# Patient Record
Sex: Female | Born: 1970 | Race: Black or African American | Hispanic: No | Marital: Married | Smoking: Never smoker
Health system: Southern US, Community
[De-identification: ages and names within clinical notes are randomized; demographics above are authoritative.]

## PROBLEM LIST (undated history)

## (undated) DIAGNOSIS — H9190 Unspecified hearing loss, unspecified ear: Secondary | ICD-10-CM

## (undated) DIAGNOSIS — I10 Essential (primary) hypertension: Secondary | ICD-10-CM

## (undated) DIAGNOSIS — O1490 Unspecified pre-eclampsia, unspecified trimester: Secondary | ICD-10-CM

## (undated) DIAGNOSIS — E559 Vitamin D deficiency, unspecified: Secondary | ICD-10-CM

## (undated) HISTORY — DX: Unspecified pre-eclampsia, unspecified trimester: O14.90

## (undated) HISTORY — DX: Vitamin D deficiency, unspecified: E55.9

## (undated) HISTORY — PX: OTHER SURGICAL HISTORY: SHX169

## (undated) HISTORY — DX: Unspecified hearing loss, unspecified ear: H91.90

## (undated) HISTORY — DX: Essential (primary) hypertension: I10

---

## 1999-04-27 ENCOUNTER — Other Ambulatory Visit: Admission: RE | Admit: 1999-04-27 | Discharge: 1999-04-27 | Payer: Self-pay | Admitting: Family Medicine

## 2002-04-15 ENCOUNTER — Other Ambulatory Visit: Admission: RE | Admit: 2002-04-15 | Discharge: 2002-04-15 | Payer: Self-pay | Admitting: *Deleted

## 2002-05-08 ENCOUNTER — Encounter: Payer: Self-pay | Admitting: *Deleted

## 2002-05-08 ENCOUNTER — Ambulatory Visit (HOSPITAL_COMMUNITY): Admission: RE | Admit: 2002-05-08 | Discharge: 2002-05-08 | Payer: Self-pay | Admitting: *Deleted

## 2003-03-31 ENCOUNTER — Other Ambulatory Visit: Admission: RE | Admit: 2003-03-31 | Discharge: 2003-03-31 | Payer: Self-pay | Admitting: Gynecology

## 2003-08-12 ENCOUNTER — Inpatient Hospital Stay (HOSPITAL_COMMUNITY): Admission: AD | Admit: 2003-08-12 | Discharge: 2003-08-12 | Payer: Self-pay | Admitting: Gynecology

## 2003-08-22 ENCOUNTER — Inpatient Hospital Stay (HOSPITAL_COMMUNITY): Admission: AD | Admit: 2003-08-22 | Discharge: 2003-08-23 | Payer: Self-pay | Admitting: Gynecology

## 2003-09-02 ENCOUNTER — Inpatient Hospital Stay (HOSPITAL_COMMUNITY): Admission: AD | Admit: 2003-09-02 | Discharge: 2003-09-07 | Payer: Self-pay | Admitting: Gynecology

## 2003-09-04 ENCOUNTER — Encounter (INDEPENDENT_AMBULATORY_CARE_PROVIDER_SITE_OTHER): Payer: Self-pay | Admitting: Specialist

## 2003-09-07 ENCOUNTER — Inpatient Hospital Stay (HOSPITAL_COMMUNITY): Admission: AD | Admit: 2003-09-07 | Discharge: 2003-09-09 | Payer: Self-pay | Admitting: Gynecology

## 2003-09-10 ENCOUNTER — Ambulatory Visit: Admission: RE | Admit: 2003-09-10 | Discharge: 2003-09-10 | Payer: Self-pay | Admitting: Internal Medicine

## 2003-10-16 ENCOUNTER — Other Ambulatory Visit: Admission: RE | Admit: 2003-10-16 | Discharge: 2003-10-16 | Payer: Self-pay | Admitting: Gynecology

## 2004-07-12 ENCOUNTER — Ambulatory Visit: Payer: Self-pay | Admitting: Internal Medicine

## 2004-10-17 ENCOUNTER — Other Ambulatory Visit: Admission: RE | Admit: 2004-10-17 | Discharge: 2004-10-17 | Payer: Self-pay | Admitting: Gynecology

## 2005-01-10 ENCOUNTER — Ambulatory Visit: Payer: Self-pay | Admitting: Internal Medicine

## 2005-03-07 ENCOUNTER — Other Ambulatory Visit: Admission: RE | Admit: 2005-03-07 | Discharge: 2005-03-07 | Payer: Self-pay | Admitting: Gynecology

## 2005-07-11 ENCOUNTER — Ambulatory Visit: Payer: Self-pay | Admitting: Internal Medicine

## 2005-07-31 ENCOUNTER — Ambulatory Visit: Payer: Self-pay | Admitting: Endocrinology

## 2005-08-03 ENCOUNTER — Ambulatory Visit: Payer: Self-pay | Admitting: Internal Medicine

## 2005-08-11 ENCOUNTER — Encounter (INDEPENDENT_AMBULATORY_CARE_PROVIDER_SITE_OTHER): Payer: Self-pay | Admitting: Specialist

## 2005-08-11 ENCOUNTER — Inpatient Hospital Stay (HOSPITAL_COMMUNITY): Admission: AD | Admit: 2005-08-11 | Discharge: 2005-08-15 | Payer: Self-pay | Admitting: Gynecology

## 2005-09-25 ENCOUNTER — Other Ambulatory Visit: Admission: RE | Admit: 2005-09-25 | Discharge: 2005-09-25 | Payer: Self-pay | Admitting: Gynecology

## 2006-11-02 ENCOUNTER — Other Ambulatory Visit: Admission: RE | Admit: 2006-11-02 | Discharge: 2006-11-02 | Payer: Self-pay | Admitting: Gynecology

## 2007-01-08 ENCOUNTER — Encounter: Payer: Self-pay | Admitting: Internal Medicine

## 2007-11-18 ENCOUNTER — Other Ambulatory Visit: Admission: RE | Admit: 2007-11-18 | Discharge: 2007-11-18 | Payer: Self-pay | Admitting: Gynecology

## 2008-02-03 ENCOUNTER — Ambulatory Visit: Payer: Self-pay | Admitting: Gynecology

## 2008-12-08 ENCOUNTER — Ambulatory Visit: Payer: Self-pay | Admitting: Gynecology

## 2008-12-08 ENCOUNTER — Encounter: Payer: Self-pay | Admitting: Gynecology

## 2008-12-08 ENCOUNTER — Other Ambulatory Visit: Admission: RE | Admit: 2008-12-08 | Discharge: 2008-12-08 | Payer: Self-pay | Admitting: Gynecology

## 2010-01-05 ENCOUNTER — Other Ambulatory Visit: Admission: RE | Admit: 2010-01-05 | Discharge: 2010-01-05 | Payer: Self-pay | Admitting: Gynecology

## 2010-01-05 ENCOUNTER — Ambulatory Visit: Payer: Self-pay | Admitting: Gynecology

## 2010-02-28 ENCOUNTER — Ambulatory Visit: Payer: Self-pay | Admitting: Gynecology

## 2010-08-02 ENCOUNTER — Ambulatory Visit (INDEPENDENT_AMBULATORY_CARE_PROVIDER_SITE_OTHER): Payer: BC Managed Care – PPO | Admitting: Gynecology

## 2010-08-02 DIAGNOSIS — B373 Candidiasis of vulva and vagina: Secondary | ICD-10-CM

## 2010-08-02 DIAGNOSIS — L293 Anogenital pruritus, unspecified: Secondary | ICD-10-CM

## 2010-08-02 DIAGNOSIS — N898 Other specified noninflammatory disorders of vagina: Secondary | ICD-10-CM

## 2010-09-09 NOTE — Discharge Summary (Signed)
NAME:  Adriana Brown, Adriana Brown                        ACCOUNT NO.:  [id number]   MEDICAL RECORD NO.:  [id number]                   PATIENT TYPE:  INP   LOCATION:  9319                                 FACILITY:  [facility]   PHYSICIAN:  Juan H. Lily Peer, M.D.             DATE OF BIRTH:  [birth year]   DATE OF ADMISSION:  09/02/2003  DATE OF DISCHARGE:  09/07/2003                                 DISCHARGE SUMMARY   TOTAL DAYS HOSPITALIZED:  Five.   HISTORY:  The patient is a 40 year old gravida 1 para 0, now para 1, who was  admitted at 35 and five-sevenths weeks gestation due to the fact that she  had been complaining of abdominal pain and elevated blood pressures.  She  had been known to have chronic hypertension and had been on Aldomet 500 mg  t.i.d.  At [facility] she was found to have blood pressures that were  153/105, 163/111, 160/112, 163/109.  She was given 20 mg of labetalol IV,  was admitted to antepartum for continued monitoring and plans of starting on  magnesium sulfate tocolysis and subsequently giving her betamethasone 12.5  mg and repeating in 24 hours with plans of early delivery.  Her labs in the  emergency room had demonstrated hemoglobin and hematocrit of 12.5 and 37.1  respectively with platelet count of 174,00.  Comprehensive metabolic panel:  SGOT at 64, SGPT at 68, LDH was slightly elevated at 263.  Her urine had  evidence of proteinuria at 100 mg/dl, and trace ketonuria.  An ultrasound  was obtained which demonstrated the fetus to be in the vertex presentation,  AFI of 12.6, estimated fetal weight was between the 10th to 25th percentile  with an abdominal circumference slightly lagging measuring 31 weeks as well  as the BPP and femur length.  On the following day the [facility] panel was  repeated, platelet count was 166, LDH 249, uric acid 6.5, SGOT and SGPT 80  and 86 respectively, and urine output was averaging about 30-50 mL/hour.  She was started on Cervidil and 24 hours  later was started on Pitocin.  Since she was early into her pregnancy group B strep prophylaxis initiated  with 5,000,000 units of penicillin G IV followed by 2,500,000 units q.4h.  She underwent artificial rupture of membranes with clear fluid.  High-dose  Pitocin was started and she was on magnesium sulfate at 2 g/hour after a 4 g  bolus had been given on admission.  She had 2+ pitting edema and two-beat  clonus.  Her systolics were in the 130s and diastolics were in the 80-90  range.  She eventually was taken for a primary cesarean section secondary to  arrest of first stage of labor and nonreassuring fetal heart rate tracing.  The primary lower uterine segment transverse cesarean section resulted in a  viable female infant, Apgars of 7 and 8, with a weight of 3 pounds 11  ounces, arterial cord pH 7.24, and she had 600 mL blood loss.  Postpartum  day #1 her hemoglobin and hematocrit were 9.4 and 28.6; platelets 145,000.  Blood pressure systolics in the 130-140 range, the diastolic 70-80s.  Urine  output averaged 200 mL/hour.  O2 saturations were 92-97%.  Lungs had been  clear.  She still had 1+ pitting edema.  Her magnesium sulfate was  discontinued after 24 hours.  Postpartum day #2 her Tmax was 100, systolics  140-160 mmHg range, diastolics 70-90 mmHg range.  O2 saturations on room air  were ranging between 91% and 93%.  Respiratory rate had been 18-20 with  excellent urine output and the lungs were clear.  She was sent for a chest x-  ray which was reported to be normal with a small amount of pleural effusion  noted at the base and so she was given 40 of Lasix p.o.  She continued to  urinate well and she was transferred to the ward.  On postoperative day #3  her blood pressures were 153/91, 150/95, 154/93, 170/98, 167/65.  She had  excellent urine output.  O2 saturations on room air were 97% and her pitting  edema was now down to 1+ with no clonus, and she was ready to be discharged   home.  She was released on Aldomet 500 mg t.i.d.  She was to continue her  iron supplementation and her prenatal vitamins.  Her  staples were removed and incision had Steri-Strips applied.  She was to  maintain blood pressure readings at home three to four times a day and was  to be seen in the office in 48-72 hours for follow-up blood pressure  measurements.  Signs and symptoms of toxemia were discussed with her and  blood type was O positive, rubella immune.                                               Juan H. Lily Peer, M.D.    JHF/MEDQ  D:  09/15/2003  T:  09/15/2003  Job:  [id number]

## 2010-09-09 NOTE — Discharge Summary (Signed)
Adriana, Brown              ACCOUNT NO.:  [id number]   MEDICAL RECORD NO.:  [id number]          PATIENT TYPE:  INP   LOCATION:  9317                          [facility]ACILITY[facility]  [facility]   PHYSICIAN:  Juan H. Lily Peer, M.D.DATE OF BIRTH:  1970-06-25   DATE OF ADMISSION:  08/11/2005  DATE OF DISCHARGE:  08/15/2005                                 DISCHARGE SUMMARY   HISTORY:  Patient is a 40 year old gravida 2, para 1 with previous cesarean  section at [redacted] weeks gestation for HELLP syndrome.  Patient with hypertension  during this pregnancy had been on labetalol had presented to the office at  [redacted] weeks gestation with bright red bleeding per vagina and was found to have  elevated blood pressure and was taken for an emergency cesarean section with  suspicion of an abruptio placenta.  The patient underwent a repeat lower  uterine segment transverse cesarean section by Dr. Douglass Rivers.  The  patient delivered a viable female infant in the vertex presentation with  Apgars of 8 and 9.  There was noted at time of delivery 15% of the placenta  had been abrupted but pathology report confirmed that there was organized  hemorrhage on the maternal surface consistent with an abruptio three-vessel  umbilical cord, no abnormalities noted in the cord and no evidence of  chorioamnionitis.  The patient's PIH labs on admission were normal.  She was  kept in the AICU with magnesium sulfate 4 gram bolus followed by 2 grams/hr  and eventually after 24 hours it was discontinued and she was kept on her  labetalol.  Her first postop day hemoglobin/hematocrit were 10.1 and 29.4,  respectively, with a platelet count 154,000.  Her comprehensive metabolic  panel was normal.  Of note on admission the patient did have Kleihauer-Betke  test done which was negative for fetal cells in the maternal circulation.  After 24 hours the magnesium sulfate had been discontinued and the patient's  Foley catheter was removed after she had  adequate urinary output.  She was  started on clear liquid diet and advanced to regular diet and she was kept  on labetalol at 200 mg b.i.d. and was ready to be discharged home on the  third postoperative day.  She was O positive, rubella immune, her incision  site was intact, staples were removed, the incision was Steri-Stripped and  she was ready for discharge home.   FINAL DIAGNOSES:  1.  Abruptio placenta at [redacted] weeks gestation.  2.  Previous cesarean section.  3.  Chronic hypertension.  4.  Anemia.   PROCEDURE PERFORMED:  Repeat lower uterine segment transverse cesarean  section.   DISPOSITION AND FOLLOWUP:  The patient was discharged home on her fourth  postoperative day, she was up ambulating well, tolerating regular diet well,  her staples were removed, her incision was Steri-Stripped.  She was given a  prescription for iron supplementation to take one p.o. daily along with  prenatal vitamins one p.o. daily, for pain relief Lortab 7.5/500 one p.o.  daily and interchange that with Motrin 800 t.i.d. p.r.n.  She will continue  on labetalol 200 mg b.i.d. as the same she was taking it in the hospital and  to follow up in the office next week for blood pressure check.     Juan H. Lily Peer, M.D.  Electronically Signed    JHF/MEDQ  D:  09/01/2005  T:  09/03/2005  Job:  [id number]

## 2010-09-09 NOTE — H&P (Signed)
NAME:  Adriana Brown, Adriana Brown                        ACCOUNT NO.:  [id number]   MEDICAL RECORD NO.:  [id number]                   PATIENT TYPE:  INP   LOCATION:  9166                                 [facility]   PHYSICIAN:  Juan H. Lily Peer, M.D.             DATE OF BIRTH:  1971/03/02   DATE OF ADMISSION:  09/02/2003  DATE OF DISCHARGE:                                HISTORY & PHYSICAL   CHIEF COMPLAINT:  Abdominal pain, elevated blood pressure.   HISTORY:  The patient is a 40 year old gravida 1 para 0 currently 59 and  five-sevenths weeks gestation who had presented to [facility]  approximately 0400 complaining of abdominal discomfort.  She stated that  this started around 3:30 a.m. and she got up and had her blood pressure  taken and her diastolic blood pressure was 115 mmHg.  She denied any  headaches or any visual disturbances.  She was diagnosed with chronic  hypertension at approximately [redacted] weeks gestation and had been on Aldomet 500  mg t.i.d.  When she presented to [facility] her blood pressures were  as follows:  153/105, 163/111, 160/112, 163/109, and she was given 20 mg of  labetalol IV which began to bring down her blood pressures.  When she was  brought up to the room her blood pressure was now 144/91 and the patient did  not complain of any visual disturbances and did not have any right upper  quadrant tenderness on exam but stated it was kind of a vague discomfort in  her upper abdomen.  Her labs in the emergency room demonstrated hemoglobin  and hematocrit at 12.5 and 37.1 respectively with platelet count of 174,000;  white blood count 18,000.  Comprehensive metabolic panel had an elevated  SGOT at 64, SGPT elevated at 68, and LDH was elevated slightly at 263.  Her  urine had evidence of proteinuria 100 mg/dl and trace ketonuria.  We  proceeded in obtaining an ultrasound in an effort to get fetal Doppler flow  studies.  The fetal Doppler flow studies for [redacted]  weeks gestation was normal.  The fetus is in the vertex presentation with an AFI of 12.6.  The estimated  fetal weight was between the 10th and 25th percentile with abdominal  circumference consistent with 31 weeks as well as the BPD and femur length.  The patient was then given betamethasone 12.5 mg IM which we will repeat in  24 hours and she was started on magnesium sulfate tocolysis consisting of a  4 g bolus of magnesium sulfate followed by 2 g/hour.   PAST MEDICAL HISTORY:  1. 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  2. Had a wisdom tooth removed in 1999.  3. Chronic hypertension established at [redacted] weeks gestation.   MEDICATIONS:  Aldomet - she is up to 500 mg t.i.d.   Maternal serum alpha-fetoprotein had been declined.  The patient did have  parvovirus B19  tested due to concern of exposure she may have had to someone  with fifth disease and was reported to be negative.   REVIEW OF SYSTEMS:  See [place] form.   PHYSICAL EXAMINATION:  VITAL SIGNS:  Current blood pressure was 114/91.  HEENT:  Unremarkable.  NECK:  Supple, trachea midline.  No carotid bruits, no thyromegaly.  LUNGS:  Clear to auscultation without any rhonchi or wheezes.  HEART:  Regular rate and rhythm without murmurs, rubs, or gallops.  BREAST:  Exam not done.  ABDOMEN:  Soft, no tenderness.  PELVIC:  Cervix was 1 cm, 50% effaced, and posterior.  EXTREMITIES:  Reflexes DTR 2+, 1+ to 2+ pitting edema.   ASSESSMENT:  A 40 year old gravida 1 para 0 at 13 and five-sevenths weeks  gestation with chronic hypertension/superimposed preeclampsia awoke this  morning with abdominal discomfort, took her blood pressure at home and found  to be elevated, and was instructed to come to the hospital.  Her blood  pressure was found to be elevated but responded to labetalol and magnesium  sulfate for seizure prophylaxis.  She received 12.5 mg of betamethasone.  A  second dose will be repeated in 24 hours.  We will repeat  her PIH panel in  the next 6-8 hours and continue to monitor closely.  We had discussed that  we hoped to get a second dose of the betamethasone to stimulate fetal lung  maturity in an effort to deliver her if her clinical parameters indicate  that the condition is deteriorating.  Will also have the neonatal team come  and talk to the patient as well and she understands that condition is  fragile and will have to be making adjustments accordingly, but a decision  may need to be made to be delivered prematurely if she does not respond to  bedrest and antihypertensive medication for which we will increase Aldomet  from 500 t.i.d. to 500 q.i.d. and continue to monitor closely.  Her prenatal  labs are in chart and admission labs as dictated above.   PLAN:  Per assessment above.                                               Juan H. Lily Peer, M.D.    JHF/MEDQ  D:  09/02/2003  T:  09/02/2003  Job:  [id number]

## 2010-09-09 NOTE — Consult Note (Signed)
NAME:  Adriana Brown, Adriana Brown                        ACCOUNT NO.:  [id number]   MEDICAL RECORD NO.:  [id number]                   PATIENT TYPE:  INP   LOCATION:  9173                                 [facility]   PHYSICIAN:  Juan H. Lily Peer, M.D.             DATE OF BIRTH:  [birth year]   DATE OF CONSULTATION:  DATE OF DISCHARGE:                                   CONSULTATION   HISTORY:  The patient is a 40 year old gravida 1, para 0, who presented to  [facility] stating that earlier this evening she felt like her heart  was pacing, so she went to the [facility] and had her blood pressure taken and was  told that her blood pressure was slightly elevated but her pulse was normal.  The patient denied any visual disturbances, no headaches, no right upper  quadrant pain, no prior history of hypertension.  So far, this pregnancy has  been uncomplicated.  She is currently 30-1/2 weeks' gestation.  When she  came in, her vital signs were as follows:  Temperature was 98.7, pulse 84,  respirations 20, blood pressure 147/81.  Serial blood pressure readings were  obtained, and they were as follows:  127/86, 133/88, 132/82, 131/88, 133/79.  A PIH panel was done, where the hemoglobin and hematocrit were 11.3 and  33.9, respectively, platelet count 199,000, white blood count was 16.8.  Her  urinalysis essentially unremarkable.  Urine slightly concentrated but  otherwise normal parameters.  Wet prep was normal, and LDH, uric acid, and  the remainder of comprehensive metabolic panel was normal.  She had a  reactive nonstress test.   PHYSICAL EXAMINATION:  GENERAL:  The patient was in no acute distress.  HEENT:  Unremarkable.  NECK:  Supple.  Trachea midline, no carotid bruits, no thyromegaly.  CHEST:  The lungs were clear to auscultation without any rhonchi or wheezes.  CARDIAC:  Regular rate and rhythm, no murmurs or gallops.  BREASTS:  Exam not done.  ABDOMEN:  Soft, nontender, no rebound or  guarding.  PELVIC:  Not done.  EXTREMITIES:  DTR 1+, negative clonus, trace edema.   ASSESSMENT:  A 39 year old gravida 1, para 0, at 30-1/2 weeks' estimated  gestational age, earlier this evening with episode of pacing in her heart.  She was placed on the monitor and found to be noncontracting, normotensive,  borderline blood pressure reading but normal heart rate, and no abnormality  on cardiac auscultation.  The patient has an appointment tomorrow in the  office.  I will see her 30 minutes before scheduled appointment and do  serial blood pressure readings.  Besides the above-mentioned PIH panel  having been done, will also proceed with getting a TSH tonight and will  reassess tomorrow when she has her appointment.  Instruction sheet on signs  and symptoms of preeclampsia were provided, all questions were answered, and  will follow accordingly.  Juan H. Lily Peer, M.D.    JHF/MEDQ  D:  08/12/2003  T:  08/13/2003  Job:  [id number]

## 2010-09-09 NOTE — Op Note (Signed)
Adriana Brown, Adriana Brown              ACCOUNT NO.:  [id number]   MEDICAL RECORD NO.:  [id number]          PATIENT TYPE:  INP   LOCATION:  9317                          [facility]ACILITY:  [facility]   PHYSICIAN:  Ivor Costa. Farrel Gobble, M.D. DATE OF BIRTH:  1970/11/28   DATE OF PROCEDURE:  08/11/2005  DATE OF DISCHARGE:                                 OPERATIVE REPORT   PREOPERATIVE DIAGNOSES:  1.  Abruption at 34 weeks.  2.  Previous cesarean section.  3.  Chronic hypertension.   POSTOPERATIVE DIAGNOSES:  1.  Abruption at 34 weeks.  2.  Previous cesarean section.  3.  Chronic hypertension.   PROCEDURE:  Repeat cesarean section, low flap transverse.   SURGEON:  Ivor Costa. Farrel Gobble, M.D.   ASSISTANT:  Scrub tech.   ANESTHESIA:  Spinal.   ESTIMATED BLOOD LOSS:  600 mL.   IV FLUIDS:  1700 mL of lactated Ringer's.   URINE OUTPUT:  100 mL of clear urine.   FINDINGS:  A viable female in the vertex presentation with Apgars of 8 and 9.  Birth weight is pending.  Roughly 15% placenta abruption was noted at the  time of delivery.  Tubes and ovaries were unremarkable.  The pH was 7.2.   COMPLICATIONS:  None.   PATHOLOGY:  Placenta.   PROCEDURE:  The patient was taken to the operating room.  Spinal anesthesia  was induced after adequate platelet level was appreciated.  Prep and drape  in the usual sterile fashion.  After adequate anesthesia was insured, a  Pfannenstiel skin incision was made with a scalpel going through the  previous C-section scar and carried through the underlying layer of fascia  with the scalpel.  The fascia was nicked and incision was extended laterally  with the Bovie.  The superior aspect of the incision was grasped with  Kochers and underlying rectus muscles were dissected off by blunt and sharp  dissection.  They were noted to be markedly scarred to the muscle.  Similarly, the inferior aspect of the incision was grasped with Kochers and  underlying rectus was dissected off.  The  rectus muscles were separated in  the midline.  The peritoneum was identified and entered bluntly.  The  incision was then extended superiorly and inferiorly.  The bladder blade was  inserted.  The vesicouterine peritoneum was identified.  It was markedly  scarred and unable to be elevated.  There was noted to be  extraperitonealization of the muscles laterally which somewhat obscured the  layers.  An incision was made superior to the bladder reflection.  Clear  amniotic fluid was noted upon entering the cavity and the incision was  extended bluntly.  The infant was delivered from the vertex presentation.  Cord was cut and clamped.  The pH was obtained.  Cord bloods were also  obtained.  The placenta was delivered almost spontaneously after the infant.  About a 15% abruption was appreciated.   The uterus was then cleared of all clots and debris.  The uterus contracted  nicely.  The incision was then repaired with a running locked layer of  0  chromic and a second suture was used for imbrication.  There was noted to be  bleeding from the peritoneum at the end of the procedure that required  suture ligature in order to achieve hemostasis.  The adnexa were inspected  and noted to be unremarkable.  The pelvis was irrigated with copious saline  and was felt to be stable.  As we began closing, the patient had a  generalized ooze but no active bleeding from the peritoneum, the muscle as  well as the omentum and these areas were treated where seen and appropriate.  Because of the extensive scarring in the pelvis, the rectus muscles were  brought together in the midline.  Small areas of ooze were treated on the  muscle where appropriate; however, we did elect to put a layer of Interceed  subfascially.  The fascia was then closed with 0 Vicryl in a running  fashion.  The subcutaneous was irrigated and treated again where appropriate  with cautery.  Skin was closed with staples.  A pressure dressing  was  placed.  The patient tolerated the procedure well.  Sponge, lap and needle  counts correct x 2.  She was transferred to the PACU in stable condition.      Ivor Costa. Farrel Gobble, M.D.  Electronically Signed     THL/MEDQ  D:  08/11/2005  T:  08/14/2005  Job:  [id number]

## 2010-09-09 NOTE — H&P (Signed)
Adriana Brown, PAT              ACCOUNT NO.:  [id number]   MEDICAL RECORD NO.:  [id number]          PATIENT TYPE:  MAT   LOCATION:  MATC                          [facility]  [facility]   PHYSICIAN:  Ivor Costa. Farrel Gobble, M.D. DATE OF BIRTH:  1970-05-03   DATE OF ADMISSION:  08/11/2005  DATE OF DISCHARGE:                                HISTORY & PHYSICAL   CHIEF COMPLAINT:  Thirty-four weeks, questionable abruption.   HISTORY OF PRESENT ILLNESS:  The patient is a 40 year old G2, P0-1-0-1, with  a history of previous C-section at 33 weeks secondary to HELLP syndrome.  The patient has been diagnosed with chronic hypertension since that first  pregnancy, and she is currently being managed with labetalol.  Because of  that, the patient has been followed in the office with NSTs and AFI.  She  presents to the office today for a routine scheduled visit for her non-  stress test.  The patient reported starting vaginal bleeding earlier this  afternoon.  At the point when she presented to the office, it was felt to be  brisk.  There was no documentation, however, of rupture.  The patient  reported fetal movement, although it was decreased.  The patient denied  contractions.  She also denied headache, visual changes, nausea, vomiting,  epigastric pain, or upper extremity edema.  The patient states that she has  been compliant with her labetalol.  She was noted to have a blood pressure  of approximately 160/100 when she was in the office, although she was  somewhat agitated.  She was sent immediately because of the vaginal bleeding  to triage.  In triage the patient is more calm and she is without any  specific complaints.  She has had minimal vaginal bleeding since presenting.  She is O positive, antibody negative, RPR nonreactive, rubella immune,  hepatitis B surface antigen nonreactive, HIV nonreactive.  Glucola is  negative.   PHYSICAL EXAMINATION:  VITAL SIGNS:  The patient's blood pressure at  present  is 135/93.  GENERAL:  On physical exam, she is a calm female in no acute distress.  CARDIAC:  Regular rate.  LUNGS:  Clear to auscultation.  ABDOMEN:  Gravid, soft and nontender without rebound or guarding.  Fetal  heart tones in the form of NSTs were in process.  There was minimal  variability.  There were noted to be contractions every one to 1-1/2  minutes.  PELVIC:  Vaginal exam was deferred.  EXTREMITIES:  Notable for edema.   ASSESSMENT:  1.  History of previous hemolysis, elevated liver enzymes, and low platelet      count.  2.  History of chronic hypertension.  3.  History of previous preterm delivery at 33 weeks secondary to above, who      now presents at 34 weeks with suspicion for early abruption.   Because of the patient with a previous cesarean section, we will proceed to  the OR immediately.  At the time of this dictation, PIH labs and KleihauerLorre Munroe are pending.  The risks and benefits of the surgery, including the  prematurity of the infant, were discussed with the patient and accepted.  We  will now proceed to the OR.      Ivor Costa. Farrel Gobble, M.D.  Electronically Signed     THL/MEDQ  D:  08/11/2005  T:  08/11/2005  Job:  [id number]

## 2010-09-09 NOTE — Op Note (Signed)
NAME:  Adriana Brown, Adriana Brown                        ACCOUNT NO.:  [id number]   MEDICAL RECORD NO.:  [id number]                   PATIENT TYPE:  INP   LOCATION:  9374                                 FACILITY:  [facility]   PHYSICIAN:  Juan H. Lily Peer, M.D.             DATE OF BIRTH:  09/26/1970   DATE OF PROCEDURE:  09/04/2003  DATE OF DISCHARGE:                                 OPERATIVE REPORT   INDICATIONS FOR PROCEDURE:  A 40 year old, gravida 1, para 0 at [redacted] weeks  gestation was admitted on May 11 with chronic hypertension superimposed  preeclampsia received magnesium sulfate for seizure prophylaxis and also had  received betamethasone 12.5 mg IM and which was repeated again 24 hours  later and started induction with cervical ripening with Cervidil on May 12.  This morning Pitocin was started in the patient with arrest of her stage of  labor and a questionable nonreassuring fetal heart rate tracing.   PREOPERATIVE DIAGNOSES:  1. Preterm delivery at [redacted] weeks gestation.  2. Chronic hypertension with superimposed preeclampsia.  3. Nonreassuring fetal heart rate tracing.  4. Arrest of her stage of labor.   POSTOPERATIVE DIAGNOSES:  1. Preterm delivery at [redacted] weeks gestation.  2. Chronic hypertension with superimposed preeclampsia.  3. Nonreassuring fetal heart rate tracing.  4. Arrest of her stage of labor.   ANESTHESIA:  Epidural.   PROCEDURE:  Primary low uterine segment transverse cesarean section.   FINDINGS:  A viable female infant with Apgar of 7 & 8 with a weight of 3  pounds 11 ounces, arterial cord pH of 7.24, clear amniotic fluid, normal  maternal tubes and ovaries, a subserosal 2 x 3 cm posterior lower uterus  fibroid was noted.   DESCRIPTION OF PROCEDURE:  After the patient was adequately counseled, she  was taken to the operating room where she was placed in low lithotomy  position. She was redosed through her epidural catheter, Foley catheter was  in place, the abdomen  was prepped and draped in the usual sterile fashion.  A Pfannenstiel skin incision was made 2 cm above the symphysis pubis, the  incision was carried down from the skin, subcutaneous tissue down to the  rectus fascia whereby a midline nick was made,  the fascia was incised in a  transverse fashion.  The peritoneal cavity was entered cautiously, the  bladder flap was established, the lower uterine segment was incised in a  transverse fashion, clear amniotic fluid was present and the newborn was  delivered.  The nasopharyngeal area was bulb suctioned, the newborn gave an  immediate cry. The cord was doubly clamped and excised, shown to the parents  and passed to the neonatologists who were in attendance who gave the above  mentioned parameters.  After cord blood was obtained, the placenta was  delivered in the intrauterine cavity and submitted for histological  evaluation. The uterus was then exteriorized and intrauterine cavity was  swept clear of any products of conception and the transverse incision was  closed in a locking stitch manner with #0 Vicryl suture.  The uterus was  then placed back in the pelvic cavity, the pelvic cavity was copiously  irrigated with normal saline solution.  Inspection of the lower uterine  segment transverse incision demonstrated adequate hemostasis and closure was  started. The visceral peritoneum was not closed but the rectus fascia was  closed with a running stitch of #0 Vicryl suture. The subcutaneous bleeders  were Bovie cauterized and the skin was reapproximated with skin clips  following by placement of Xeroform gauze and 4 x 8 dressing. The patient was  transferred to the recovery room with stable vital signs, she was  normotensive throughout the procedure. Blood loss during the procedure was  recorded to be 600 mL, IV fluids 1300 mL of lactated Ringer's  and urine  output 50 mL and clear.  She had received __________ 5 mU at approximately  11:30 this  morning. Will watch her blood pressures and decide if we need to  restart her back on the __________ and meanwhile she will be kept on mag  sulfate 1 gm/hour for the first 24 hours for seizure prophylaxis.                                               Juan H. Lily Peer, M.D.    JHF/MEDQ  D:  09/04/2003  T:  09/05/2003  Job:  [id number]

## 2010-09-09 NOTE — Discharge Summary (Signed)
NAME:  Adriana Brown, Adriana Brown                        ACCOUNT NO.:  [id number]   MEDICAL RECORD NO.:  [id number]                   PATIENT TYPE:  INP   LOCATION:  9315                                 FACILITY:  [facility]   PHYSICIAN:  Timothy P. Fontaine, M.D.           DATE OF BIRTH:  25-Sep-1970   DATE OF ADMISSION:  09/07/2003  DATE OF DISCHARGE:                                 DISCHARGE SUMMARY   DISCHARGE DIAGNOSES:  1. Postpartum status post delivery at 34 weeks, cesarean section.  2. Chronic hypertension.  3. Hemolysis, elevated liver enzymes, and low platelet count syndrome.  4. Postpartum exacerbation of hemolysis, elevated liver enzymes, and low     platelet count syndrome.  5. Pulmonary edema.   PROCEDURES:  None.   HOSPITAL COURSE:  A 40 year old G1 P1 female status post cesarean section at  34 weeks for HELLP syndrome 4 days prior to admission.  The patient was  discharged home improving, noted that she was experiencing increasing  swelling, and she checked her blood pressures at home when they were found  to be in the range of 170 over 110 and she presented for evaluation.  Triage  evaluation found the patient had blood pressures in the 150 to 170 over 90  to 98 range with elevated liver function studies and she was admitted for  antihypertensive therapy and further evaluation.  The patient was found to  be in pulmonary edema with decreased oxygen saturations, chest x-ray  confirming pulmonary edema.  She subsequently underwent Lasix diuresis with  over 7 L diuresed within the first 24 hours.  The patient's weight was at  200 pounds upon admission and was 177 the following day.  The patient's O2  saturations improved to normal and she was noted to have acceptable blood  pressures in the 140/90 range on Procardia XL 60 mg daily.  Her exam shows  her lungs to be clear, cardiac exam is normal.  Her abdomen was normal with  healing incision and her labs the a.m. of discharge were  improving, noting  hemoglobin of 10, platelets 274, SGOT of 27, SGPT of 62, and LDH of 216.  The patient received precautions, instructions, and ASAP call review.  She  will monitor her blood pressures as well as her symptoms at home and she  will be followed in the office in 48 hours for recheck.                                               Timothy P. Audie Box, M.D.    TPF/MEDQ  D:  09/09/2003  T:  09/09/2003  Job:  [id number]

## 2010-11-07 ENCOUNTER — Encounter: Payer: Self-pay | Admitting: *Deleted

## 2010-11-10 ENCOUNTER — Ambulatory Visit: Payer: BC Managed Care – PPO | Admitting: Gynecology

## 2010-11-17 ENCOUNTER — Encounter: Payer: Self-pay | Admitting: Gynecology

## 2010-11-17 ENCOUNTER — Ambulatory Visit: Payer: BC Managed Care – PPO | Admitting: Gynecology

## 2010-11-17 ENCOUNTER — Ambulatory Visit (INDEPENDENT_AMBULATORY_CARE_PROVIDER_SITE_OTHER): Payer: BC Managed Care – PPO | Admitting: Gynecology

## 2010-11-17 VITALS — BP 120/78

## 2010-11-17 DIAGNOSIS — Z30431 Encounter for routine checking of intrauterine contraceptive device: Secondary | ICD-10-CM

## 2010-11-17 MED ORDER — LEVONORGESTREL 20 MCG/24HR IU IUD: INTRAUTERINE_SYSTEM | Freq: Once | INTRAUTERINE | Status: DC

## 2010-11-17 NOTE — Progress Notes (Signed)
Patient presents for replacement of her Mirena IUD at a five-year interval. We discussed a options for contraception risks benefits of choices and she wants to proceed with having the IUD replaced. She's been amenorrheic on the IUD and is having no issues with it. I counseled her as to what's involved with IUD removal and placement as well as the risks to include infection both acute and long-term, uterine perforation requiring surgery to remove the IUD or migration requiring surgery to remove the IUD and the risk of failure of which she understands and accepts. She's read through the Mirena booklet and signed the consent form which we will scan into EPIC.  Exam: External BUS vagina: normal Cervix: normal IUD string visualized Uterus: anteverted normal size midline mobile nontender Adnexa: without masses or tenderness  Procedure: Cervix was visualized with a speculum the IUD string was grasped with a [place] forcep and removed. The IUD was shown to the patient and discarded subsequently the cervix was cleansed with Betadine anterior lip grasped with a single-tooth tenaculum sounded and a new Mirena IUD was placed according to manufacturer's recommendations without difficulty. The string was trimmed to the patient tolerated the procedure well and was instructed to followup in one month for a postinsertional check.  Of note: Her chart reflects hypertension and palpitations in her problem list and on questioning she no longer has either of these issues she is starkly had transient hypotension following the birth of her child but has had normal blood pressure since then and no problems with heart palpitations. I removed both of these from her problem list today.

## 2010-11-18 ENCOUNTER — Encounter: Payer: Self-pay | Admitting: Gynecology

## 2010-12-13 ENCOUNTER — Ambulatory Visit (INDEPENDENT_AMBULATORY_CARE_PROVIDER_SITE_OTHER): Payer: BC Managed Care – PPO | Admitting: Gynecology

## 2010-12-13 ENCOUNTER — Encounter: Payer: Self-pay | Admitting: Gynecology

## 2010-12-13 DIAGNOSIS — N898 Other specified noninflammatory disorders of vagina: Secondary | ICD-10-CM

## 2010-12-13 DIAGNOSIS — L293 Anogenital pruritus, unspecified: Secondary | ICD-10-CM

## 2010-12-13 DIAGNOSIS — Z30431 Encounter for routine checking of intrauterine contraceptive device: Secondary | ICD-10-CM

## 2010-12-13 DIAGNOSIS — N949 Unspecified condition associated with female genital organs and menstrual cycle: Secondary | ICD-10-CM

## 2010-12-13 DIAGNOSIS — B373 Candidiasis of vulva and vagina: Secondary | ICD-10-CM

## 2010-12-13 DIAGNOSIS — R102 Pelvic and perineal pain: Secondary | ICD-10-CM

## 2010-12-13 MED ORDER — FLUCONAZOLE 150 MG PO TABS: 150.0000 mg | ORAL_TABLET | Freq: Once | ORAL | Status: AC

## 2010-12-13 NOTE — Progress Notes (Signed)
Patient presents for IUD check having had a Mirena replaced last month. She notes some lower abdominal discomfort starting last week mostly bloating / fullness sensation no GI symptoms such as constipation diarrhea or urinary symptoms such as frequency dysuria. No fevers chills or other constitutional symptoms.  She was a little bit of vaginal itching but no other symptoms.  Exam Abdomen: Soft nontender without masses guarding rebound organomegaly Pelvic: External BUS vagina thick white discharge KOH wet prep done, cervix normal IUD string not visualized, uterus normal size midline mobile nontender, adnexa without masses or tenderness  Assessment: #1 Itching vaginal discharge. Wet prep is positive for yeast we'll treat with Diflucan 150x1 dose follow up if symptoms persist or recur. #2 Lower abdominal discomfort. Urinalysis is normal exam is normal we'll start with ultrasound rule out nonpalpable abnormality such as ovarian cyst. Will check IUD placement. #3  IUD followup. IUD string was not visualized well check ultrasound per #2 for correct placement.

## 2010-12-15 ENCOUNTER — Telehealth: Payer: Self-pay | Admitting: *Deleted

## 2010-12-15 ENCOUNTER — Ambulatory Visit: Payer: BC Managed Care – PPO | Admitting: Gynecology

## 2010-12-15 NOTE — Telephone Encounter (Signed)
PT CALLED TO GET RECENT LAB RESULTS, PT GIVEN RESULTS.

## 2010-12-20 ENCOUNTER — Encounter: Payer: Self-pay | Admitting: Gynecology

## 2010-12-20 ENCOUNTER — Other Ambulatory Visit: Payer: BC Managed Care – PPO

## 2010-12-20 ENCOUNTER — Other Ambulatory Visit: Payer: Self-pay

## 2010-12-20 ENCOUNTER — Ambulatory Visit (INDEPENDENT_AMBULATORY_CARE_PROVIDER_SITE_OTHER): Payer: BC Managed Care – PPO | Admitting: Gynecology

## 2010-12-20 DIAGNOSIS — N949 Unspecified condition associated with female genital organs and menstrual cycle: Secondary | ICD-10-CM

## 2010-12-20 DIAGNOSIS — T8339XA Other mechanical complication of intrauterine contraceptive device, initial encounter: Secondary | ICD-10-CM

## 2010-12-20 DIAGNOSIS — Z30431 Encounter for routine checking of intrauterine contraceptive device: Secondary | ICD-10-CM

## 2010-12-20 DIAGNOSIS — R102 Pelvic and perineal pain unspecified side: Secondary | ICD-10-CM

## 2010-12-20 DIAGNOSIS — N854 Malposition of uterus: Secondary | ICD-10-CM

## 2010-12-20 NOTE — Progress Notes (Signed)
Patient presents for ultrasound with recent IUD replacement with some pelvic cramping. Ultrasound shows endometrial echo 3.2 mm right / left ovaries are normal.  IUD is seen in the fundus although on transverse image it appears that the IUD is partially within the myometrium in the mid uterine segment.  Discussed with patient possible IUD partially embedded in the uterus. Patient does note that her cramping pain is actually better. She's doing no bleeding. Options for management were reviewed to include removing IUD now versus observation with reultrasound in 1-2 months to relook at the IUD. I did discuss with her that if there is a question of placement the issue of IUD efficacy from contraceptive standpoint is raised and that she would have to accept a possible increased failure risk from a contraceptive standpoint. Patient understands and accepts this risk and wants to wait and reultrasound in 1-2 months. If she has returning or increasing pain or any other issues she will represent sooner.

## 2011-01-04 ENCOUNTER — Telehealth: Payer: Self-pay | Admitting: *Deleted

## 2011-01-04 NOTE — Telephone Encounter (Signed)
Recommend wait for visit.  If more acute this week then OV with other MD available.

## 2011-01-04 NOTE — Telephone Encounter (Signed)
Pt called stating she is still having the same symptoms from office visit on 12/20/10. She has no foul odor or discharge, just vaginal discomfort. Pt has appointment on 01/10/11 for her annual exam. She is willing to wait until next week, but she did want me to tell you that the symptoms are still there. She has a u/s appointment in October, pt didn't know if you wanted to do one at her annual since no relief. Please advise.

## 2011-01-05 NOTE — Telephone Encounter (Signed)
Pt informed with the below. 

## 2011-01-09 ENCOUNTER — Encounter: Payer: Self-pay | Admitting: Gynecology

## 2011-01-10 ENCOUNTER — Encounter: Payer: Self-pay | Admitting: Gynecology

## 2011-01-10 ENCOUNTER — Ambulatory Visit (INDEPENDENT_AMBULATORY_CARE_PROVIDER_SITE_OTHER): Payer: BC Managed Care – PPO | Admitting: Gynecology

## 2011-01-10 ENCOUNTER — Other Ambulatory Visit (HOSPITAL_COMMUNITY)
Admission: RE | Admit: 2011-01-10 | Discharge: 2011-01-10 | Disposition: A | Discharge status: Home / Self Care | Payer: BC Managed Care – PPO | Source: Ambulatory Visit | Attending: Gynecology | Admitting: Gynecology

## 2011-01-10 VITALS — BP 122/76 | Ht 62.0 in | Wt 176.0 lb

## 2011-01-10 DIAGNOSIS — Z13220 Encounter for screening for lipoid disorders: Secondary | ICD-10-CM

## 2011-01-10 DIAGNOSIS — IMO0002 Reserved for concepts with insufficient information to code with codable children: Secondary | ICD-10-CM

## 2011-01-10 DIAGNOSIS — R82998 Other abnormal findings in urine: Secondary | ICD-10-CM

## 2011-01-10 DIAGNOSIS — Z01419 Encounter for gynecological examination (general) (routine) without abnormal findings: Secondary | ICD-10-CM | POA: Insufficient documentation

## 2011-01-10 DIAGNOSIS — L293 Anogenital pruritus, unspecified: Secondary | ICD-10-CM

## 2011-01-10 DIAGNOSIS — Z30431 Encounter for routine checking of intrauterine contraceptive device: Secondary | ICD-10-CM

## 2011-01-10 DIAGNOSIS — N898 Other specified noninflammatory disorders of vagina: Secondary | ICD-10-CM

## 2011-01-10 DIAGNOSIS — B373 Candidiasis of vulva and vagina: Secondary | ICD-10-CM

## 2011-01-10 DIAGNOSIS — Z131 Encounter for screening for diabetes mellitus: Secondary | ICD-10-CM

## 2011-01-10 MED ORDER — FLUCONAZOLE 200 MG PO TABS: 200.0000 mg | ORAL_TABLET | Freq: Every day | ORAL | Status: AC

## 2011-01-10 NOTE — Progress Notes (Signed)
Adriana Brown 02-Jul-1970 [id number]        40 y.o.  for annual exam.  Patient notes having intercourse a week or so ago without difficulty and then following that had several days of progressively worsening vaginal discharge and itching. She used an over-the-counter cream but still is having itching. She recently had an IUD placed in July on her followup visit we did not visualize the string ultrasound suggested that there was some malrotation of the IUD. We discussed the issues with this and she elected to have a repeat ultrasound in another month 2 months from the prior ultrasound to recheck the position.  Past medical history,surgical history, medications, allergies, family history and social history were all reviewed and documented in the EPIC chart. ROS:  Was performed and pertinent positives and negatives are included in the history.  Exam: chaperone present Filed Vitals:   01/10/11 1515  BP: 122/76   General appearance  Normal Skin grossly normal Head/Neck normal with no cervical or supraclavicular adenopathy thyroid normal Lungs  clear Cardiac RR, without RMG Abdominal  soft, nontender, without masses, organomegaly or hernia Breasts  examined lying and sitting without masses, retractions, discharge or axillary adenopathy. Pelvic  Ext/BUS/vagina  normal thick white discharge KOH wet prep done  Cervix  normal  Pap done IUD string not visualized  Uterus  axial, normal size, shape and contour, midline and mobile nontender   Adnexa  Without masses or tenderness    Anus and perineum  normal   Rectovaginal  normal sphincter tone without palpated masses or tenderness.    Assessment/Plan:  40 y.o. female for annual exam.    #1 White discharge. Wet prep is positive for yeast which I think accounts for her irritative dyspareunia. Will cover with Diflucan 200 mg daily x5 days and hopefully this will eradicate her symptoms followup if symptoms persist or recur. #2 IUD management. We again  discussed the female rotation of the IUD and whether this affects contraceptive efficacy. I do not think that her pain was due to the IUD but was consistent with an irritative infectious etiology per #1. She again accepts the possible decreased efficacy rate is first pregnancy and will follow up for ultrasound in one month for placement. The issues and options again reviewed to include removing the IUD and replacing it if it does appear to be malrotated or excepting the possible decreased efficacy rate and maintaining the IUD. #3 Health maintenance. Self breast exams on a monthly basis discussed urged. Screening mammograms between 35 and 40 were reviewed. Patient has no strong family history and prefers to wait closer to 40. Baseline labs to include CBC urinalysis glucose and lipid profile were ordered.    Dara Lords MD, 4:15 PM 01/10/2011

## 2011-02-16 ENCOUNTER — Telehealth: Payer: Self-pay | Admitting: *Deleted

## 2011-02-16 NOTE — Telephone Encounter (Signed)
Pt called to see if you still thinks she needs for her [place] that was ordered for pelvic pain  that is scheduled for Monday. Her Sx's have gotten better and feels like she can cancel the appt but wanted to make sure you thought it was ok to cancel and call back if Sx's return. PLs advise

## 2011-02-17 NOTE — Telephone Encounter (Signed)
Pt informed

## 2011-02-17 NOTE — Telephone Encounter (Signed)
Ultrasound was to check the position of her IUD and I still think that she needs to do this

## 2011-02-20 ENCOUNTER — Inpatient Hospital Stay: Admission: RE | Admit: 2011-02-20 | Payer: BC Managed Care – PPO | Source: Ambulatory Visit

## 2011-02-20 ENCOUNTER — Ambulatory Visit (INDEPENDENT_AMBULATORY_CARE_PROVIDER_SITE_OTHER): Payer: BC Managed Care – PPO

## 2011-02-20 ENCOUNTER — Ambulatory Visit (INDEPENDENT_AMBULATORY_CARE_PROVIDER_SITE_OTHER): Payer: BC Managed Care – PPO | Admitting: Gynecology

## 2011-02-20 ENCOUNTER — Encounter: Payer: Self-pay | Admitting: Gynecology

## 2011-02-20 ENCOUNTER — Other Ambulatory Visit: Payer: Self-pay | Admitting: Gynecology

## 2011-02-20 VITALS — BP 130/80

## 2011-02-20 DIAGNOSIS — R102 Pelvic and perineal pain: Secondary | ICD-10-CM

## 2011-02-20 DIAGNOSIS — T8339XA Other mechanical complication of intrauterine contraceptive device, initial encounter: Secondary | ICD-10-CM

## 2011-02-20 DIAGNOSIS — Z30431 Encounter for routine checking of intrauterine contraceptive device: Secondary | ICD-10-CM

## 2011-02-20 DIAGNOSIS — N854 Malposition of uterus: Secondary | ICD-10-CM

## 2011-02-20 DIAGNOSIS — N831 Corpus luteum cyst of ovary, unspecified side: Secondary | ICD-10-CM

## 2011-02-20 DIAGNOSIS — N949 Unspecified condition associated with female genital organs and menstrual cycle: Secondary | ICD-10-CM

## 2011-02-20 NOTE — Progress Notes (Signed)
Patient presents in follow up for ultrasound IUD check. She was having pelvic pain and it appeared that her IUD was malrotated on ultrasound. She's noticed that her pain is totally resolved she feels well and on ultrasound it appears now that she's not having pain that we're able to get a better scan that her IUD is in proper location in the fundus in that it is near her C-section scar which gave the factitious appearance that was within the myometrium but in reality it is her C-section scar area. This patient is doing well I reassured her and she'll follow up as she's due for her routine follow up sooner if any issues.

## 2011-04-25 DIAGNOSIS — E559 Vitamin D deficiency, unspecified: Secondary | ICD-10-CM

## 2011-04-25 HISTORY — DX: Vitamin D deficiency, unspecified: E55.9

## 2011-06-25 ENCOUNTER — Ambulatory Visit (INDEPENDENT_AMBULATORY_CARE_PROVIDER_SITE_OTHER): Payer: BC Managed Care – PPO | Admitting: Internal Medicine

## 2011-06-25 VITALS — BP 154/98 | HR 103 | Temp 98.5°F | Resp 18 | Ht 62.0 in | Wt 185.0 lb

## 2011-06-25 DIAGNOSIS — H9193 Unspecified hearing loss, bilateral: Secondary | ICD-10-CM | POA: Insufficient documentation

## 2011-06-25 DIAGNOSIS — H00039 Abscess of eyelid unspecified eye, unspecified eyelid: Secondary | ICD-10-CM

## 2011-06-25 DIAGNOSIS — M79601 Pain in right arm: Secondary | ICD-10-CM

## 2011-06-25 DIAGNOSIS — R209 Unspecified disturbances of skin sensation: Secondary | ICD-10-CM

## 2011-06-25 MED ORDER — DOXYCYCLINE HYCLATE 100 MG PO TABS: 100.0000 mg | ORAL_TABLET | Freq: Two times a day (BID) | ORAL | Status: AC

## 2011-06-25 NOTE — Progress Notes (Signed)
  Subjective:    Patient ID: Adriana Brown, female    DOB: [birth year], 41 y.o.   MRN: [id number]  HPIThis 41 year old is very healthy except for her weight. She has very rare visits to the doctor and is never ill. About 4 days she noticed an area of darkening of the skin along the right temporal and outer malar area. There was no swelling or tenderness. 2 days ago she developed a swollen right upper eyelid that was tender. This did not interfere with vision and there was no involvement of the inside of the eye. Today she awoke with purulent discharge in the eye and has noticed some increased watering throughout the day. They're still no visual disturbance. The discoloration of skin on the side of the face has now resolved She has had no fever and no upper Respiratory symptoms.  On awakening today she noticed that her arms and hands both feel tingly and slightly hot on the inside but cold on the outside. This has persisted for 5 or 6 hours. She has noticed no weakness and no change in skin color. There is no history of neck or shoulder injury. She can remember no unusual work yesterday    Review of SystemsNoncontributory     Objective:   Physical ExamVital signs show a blood pressure elevation which is unusual for her This was repeated at the end of the exam the CNA and was normal There is no swelling of the head or face Tympanic membranes are clear The right upper head is swollen mildly with tenderness but no discrete hordeolum There is no discharge in the eye and the conjunctiva is not injected The left is normal The nares are clear Oropharynx is clear There no pre-or postauricular nodes  The neck has a full range of motion without nodes or thyromegaly and without any radicular pain The shoulder exam is normal bilaterally Although she complains of a type of paresthesia in both hands and arms there are no sensory losses, she has full grip strength, finger thumb opposition is totally  intact, and reflexes are normal.       Assessment & Plan:  Problem #1 eyelid cellulitis Hot compresses/doxycycline 100 twice a day for 5 days  Problem #2 bilateral upper extremity paresthesias of unclear etiology Because the exam is normal and his symptoms have been present for less than a day we will follow this for now without treatment

## 2011-07-20 ENCOUNTER — Encounter: Payer: Self-pay | Admitting: Gynecology

## 2011-07-20 ENCOUNTER — Ambulatory Visit (INDEPENDENT_AMBULATORY_CARE_PROVIDER_SITE_OTHER): Payer: BC Managed Care – PPO | Admitting: Gynecology

## 2011-07-20 DIAGNOSIS — N898 Other specified noninflammatory disorders of vagina: Secondary | ICD-10-CM

## 2011-07-20 DIAGNOSIS — B373 Candidiasis of vulva and vagina: Secondary | ICD-10-CM

## 2011-07-20 DIAGNOSIS — L293 Anogenital pruritus, unspecified: Secondary | ICD-10-CM

## 2011-07-20 LAB — WET PREP FOR TRICH, YEAST, CLUE
Clue Cells Wet Prep HPF POC: NONE SEEN
Trich, Wet Prep: NONE SEEN

## 2011-07-20 MED ORDER — TERCONAZOLE 0.8 % VA CREA: 1.0000 | TOPICAL_CREAM | Freq: Every day | VAGINAL | Status: AC

## 2011-07-20 NOTE — Progress Notes (Signed)
Patient presents complaining that her vagina feels like it's on fire x1 day. Also with whitish discharge. She is on amoxicillin for an eye infection.  Exam with Sherrilyn Rist chaperone present External BUS vagina thick white discharge. Cervix normal. Uterus normal size midline mobile nontender. Adnexa without masses or tenderness.  Assessment and plan: Wet prep positive for yeast. We'll treat with Terazol 3 cream given the significance of her symptoms. Follow up if symptoms persist or recur. IUD string was not visualized as it has not been in the past with previous ultrasound demonstrating intrauterine location.

## 2011-07-20 NOTE — Patient Instructions (Addendum)
Use vaginal cream nightly for three days

## 2011-12-11 ENCOUNTER — Ambulatory Visit (INDEPENDENT_AMBULATORY_CARE_PROVIDER_SITE_OTHER): Payer: BC Managed Care – PPO | Admitting: Internal Medicine

## 2011-12-11 VITALS — BP 124/82 | HR 82 | Temp 99.2°F | Resp 16 | Ht 62.0 in | Wt 186.6 lb

## 2011-12-11 DIAGNOSIS — R51 Headache: Secondary | ICD-10-CM

## 2011-12-11 DIAGNOSIS — Z6834 Body mass index (BMI) 34.0-34.9, adult: Secondary | ICD-10-CM | POA: Insufficient documentation

## 2011-12-11 DIAGNOSIS — R030 Elevated blood-pressure reading, without diagnosis of hypertension: Secondary | ICD-10-CM | POA: Insufficient documentation

## 2011-12-11 DIAGNOSIS — I809 Phlebitis and thrombophlebitis of unspecified site: Secondary | ICD-10-CM

## 2011-12-11 NOTE — Progress Notes (Signed)
Filed Vitals:   12/11/11 1527  BP: 124/82  Pulse: 82  Temp: 99.2 F (37.3 C)  Resp: 16    #1: First felt unwell at the end of June, started having problems with R eye, saw eye doctor and given abx. Didn't get well and she kept going back to the eye doctor, but the eye pain never resolved. Has been wearing glasses instead of contacts. Is now doing better.   #2: Has been having a R sided HA that comes and goes, it's a dull pain. Hasn't tried any meds for her HA. No associated nausea or dizziness. No history of migraine. This does not interfere with activity.  #3: Has a history of pre-eclampsia. Has been checking her BP this week and the highest is 144/98. Is a little worried about HTN.There are several normal readings/she has a family history of siblings with high blood pressure  Diet is fair extra sweets/does work out 4 times a week with a trainer or friends #4: Has a vein on the R leg that is tender to touch, pain is getting progressively worse x 1 week. Worse at night. Having a hard time going to sleep but once asleep stays asleep. Not as much pain with walking. Superficial pain, not deep.  Has taken Aleve at night to help.  No dizziness, blurry vision, neck pain. No fatigue, appetite good.  PERRLA. Extra occular movements are conjugate. Clear nares/throat clear/no nodes or thyromegaly Scalp doesn't hurt to touch.  Hair is in braids but not too tight There are mild changes of folliculitis posteriorly on the neck at the hairline Neurological is intact  The right leg has a superficial thrombophlebitis that is mild along the medial aspect of the knee No deep vein thrombosis calf or thigh tenderness/Homans negative  Problem #1 muscle contraction type headache Problem #2 borderline blood pressure readings Problem #3 overweight BMI 34 Problem #4 superficial thrombophlebitis   Take aspirin4 times a day for 2-3 weeks .Apply heat for 30 minutes twice a day [employer] on exercise, weight  loss, eating healthy and salt control. Continue monitoring blood pressure 3 times a week RTC 1 month if HA doesn't resolve.

## 2012-01-17 ENCOUNTER — Encounter: Payer: Self-pay | Admitting: Gynecology

## 2012-01-17 ENCOUNTER — Ambulatory Visit (INDEPENDENT_AMBULATORY_CARE_PROVIDER_SITE_OTHER): Payer: BC Managed Care – PPO | Admitting: Gynecology

## 2012-01-17 VITALS — BP 128/80 | Ht 62.0 in | Wt 174.0 lb

## 2012-01-17 DIAGNOSIS — E559 Vitamin D deficiency, unspecified: Secondary | ICD-10-CM

## 2012-01-17 DIAGNOSIS — Z30431 Encounter for routine checking of intrauterine contraceptive device: Secondary | ICD-10-CM

## 2012-01-17 DIAGNOSIS — N898 Other specified noninflammatory disorders of vagina: Secondary | ICD-10-CM

## 2012-01-17 DIAGNOSIS — L293 Anogenital pruritus, unspecified: Secondary | ICD-10-CM

## 2012-01-17 DIAGNOSIS — Z01419 Encounter for gynecological examination (general) (routine) without abnormal findings: Secondary | ICD-10-CM

## 2012-01-17 LAB — WET PREP FOR TRICH, YEAST, CLUE: WBC, Wet Prep HPF POC: NONE SEEN

## 2012-01-17 MED ORDER — METRONIDAZOLE 500 MG PO TABS: 500.0000 mg | ORAL_TABLET | Freq: Two times a day (BID) | ORAL | Status: DC

## 2012-01-17 MED ORDER — FLUCONAZOLE 150 MG PO TABS: 150.0000 mg | ORAL_TABLET | Freq: Once | ORAL | Status: DC

## 2012-01-17 NOTE — Progress Notes (Signed)
Adriana Brown December 21, 1970 [id number]        41 y.o.  G2P2002 for annual exam.  Several issues noted below.  Past medical history,surgical history, medications, allergies, family history and social history were all reviewed and documented in the EPIC chart. ROS:  Was performed and pertinent positives and negatives are included in the history.  Exam: Fleet Contras assistant Filed Vitals:   01/17/12 1557  BP: 128/80  Height: 5\' 2"  (1.575 m)  Weight: 174 lb (78.926 kg)   General appearance  Normal Skin grossly normal Head/Neck normal with no cervical or supraclavicular adenopathy thyroid normal Lungs  clear Cardiac RR, without RMG Abdominal  soft, nontender, without masses, organomegaly or hernia Breasts  examined lying and sitting without masses, retractions, discharge or axillary adenopathy. Pelvic  Ext/BUS/vagina  normal with white discharge  Cervix  normal IUD string not visualized  Uterus  axial, normal size, shape and contour, midline and mobile nontender   Adnexa  Without masses or tenderness    Anus and perineum  normal   Rectovaginal  normal sphincter tone without palpated masses or tenderness.    Assessment/Plan:  41 y.o. [id number] female for annual exam.   1. White discharge/itching. Suspect combination of bacterial vaginosis and yeast. Wet prep otherwise unremarkable. We'll treat with Diflucan 150 mg times one and Flagyl 500 mg twice a day x7. Alcohol avoidance reviewed. Extra Diflucan given in case symptoms persist. Call if otherwise symptoms persist. 2. Right breast tenderness. Started several weeks ago. Exam is normal. Recommended observation at present. If discomfort persists over the next several weeks she knows to call and we'll start with mammogram/ultrasound. If symptoms resolve then she'll monitor and get her first mammogram next spring when she turns 42. SBE monthly reviewed. 3. Mirena IUD 10/2010. Continues amenorrheic. IUD string was not seen as in the past. Has  ultrasound documenting intrauterine placement previously. Patient comfortable with not repeating now. We'll continue to monitor as no symptoms have changed. 4. Vitamin D deficiency. Has had a low vitamin D with recent repeat showing 23. Recommended starting on a vitamin D replacement of 1000-2000 units daily. 5. Pap smear. No Pap smear done today. Last Pap smear 2012. No history of abnormal Pap smears with normal numerous reports in chart. We'll plan every 3-5 year Pap smears per current screening guidelines. 6. Health maintenance. Had recent blood work through her primary physician office all of which was normal with the exception of her vitamin D level. Follow up as noted above otherwise 1 year if continues well.    Dara Lords MD, 4:51 PM 01/17/2012

## 2012-01-17 NOTE — Patient Instructions (Signed)
Take Diflucan pill once and Flagyl twice daily for one week. If itching continues then repeat the Diflucan pill. Call if symptoms persist. Call if breast tenderness continues and we will schedule mammogram/ultrasound. Otherwise future routine mammogram after turning age 41. Follow up in one year for annual gynecologic exam.

## 2012-01-24 ENCOUNTER — Telehealth: Payer: Self-pay | Admitting: *Deleted

## 2012-01-24 MED ORDER — FLUCONAZOLE 150 MG PO TABS: 150.0000 mg | ORAL_TABLET | Freq: Once | ORAL | Status: DC

## 2012-01-24 NOTE — Telephone Encounter (Signed)
Pt said pharmacy only gave her 1 pill of diflucan 150 given on 01/17/12. rx should be diflucan 150mg  # 2 tablets. rx sent for extra tablet.

## 2012-07-12 ENCOUNTER — Other Ambulatory Visit: Payer: Self-pay | Admitting: Chiropractic Medicine

## 2012-07-12 ENCOUNTER — Ambulatory Visit
Admission: RE | Admit: 2012-07-12 | Discharge: 2012-07-12 | Disposition: A | Discharge status: Home / Self Care | Payer: BC Managed Care – PPO | Source: Ambulatory Visit | Attending: Chiropractic Medicine | Admitting: Chiropractic Medicine

## 2012-07-12 DIAGNOSIS — M545 Low back pain, unspecified: Secondary | ICD-10-CM

## 2012-07-15 ENCOUNTER — Other Ambulatory Visit: Payer: Self-pay | Admitting: Chiropractic Medicine

## 2012-07-15 DIAGNOSIS — M545 Low back pain: Secondary | ICD-10-CM

## 2012-07-17 ENCOUNTER — Other Ambulatory Visit: Payer: BC Managed Care – PPO

## 2012-07-19 ENCOUNTER — Other Ambulatory Visit: Payer: BC Managed Care – PPO

## 2013-01-17 ENCOUNTER — Encounter: Payer: Self-pay | Admitting: Gynecology

## 2013-01-17 ENCOUNTER — Ambulatory Visit (INDEPENDENT_AMBULATORY_CARE_PROVIDER_SITE_OTHER): Payer: BC Managed Care – PPO | Admitting: Gynecology

## 2013-01-17 VITALS — BP 120/76 | Ht 62.0 in | Wt 160.0 lb

## 2013-01-17 DIAGNOSIS — Z01419 Encounter for gynecological examination (general) (routine) without abnormal findings: Secondary | ICD-10-CM

## 2013-01-17 NOTE — Patient Instructions (Signed)
Follow up in one year for annual exam 

## 2013-01-17 NOTE — Progress Notes (Signed)
Adriana Brown Jul 29, 1970 [id number]        42 y.o.  [id number] for annual exam.  Doing well without complaints.  Past medical history,surgical history, medications, allergies, family history and social history were all reviewed and documented in the EPIC chart.  ROS:  Performed and pertinent positives and negatives are included in the history, assessment and plan .  Exam: Kim assistant Filed Vitals:   01/17/13 1439  BP: 120/76  Height: 5\' 2"  (1.575 m)  Weight: 160 lb (72.576 kg)   General appearance  Normal Skin grossly normal Head/Neck normal with no cervical or supraclavicular adenopathy thyroid normal Lungs  clear Cardiac RR, without RMG Abdominal  soft, nontender, without masses, organomegaly or hernia Breasts  examined lying and sitting without masses, retractions, discharge or axillary adenopathy. Pelvic  Ext/BUS/vagina  normal  Cervix  normal with IUD string visualized  Uterus  anteverted, normal size, shape and contour, midline and mobile nontender   Adnexa  Without masses or tenderness    Anus and perineum  normal   Rectovaginal  normal sphincter tone without palpated masses or tenderness.    Assessment/Plan:  42 y.o. G23P2002 female for annual exam, amenorrheic with Mirena IUD.   1. Mirena IUD 10/2009. Patient doing well he amenorrheic. IUD string visualized. We'll continue to monitor. 2. Pap smear 2012. No Pap smear done today. No history of abnormal Pap smears previously. Plan repeat Pap smear next year 3 year interval. 3. Mammography scheduled in 2 weeks. SBE monthly reviewed. 4. Health maintenance. Recently had full exam with full blood work and urinalysis at her primary physician's office. Followup one year, sooner as needed.  Note: This document was prepared with digital dictation and possible smart phrase technology. Any transcriptional errors that result from this process are unintentional.   Dara Lords MD, 2:57 PM 01/17/2013

## 2013-01-28 ENCOUNTER — Encounter: Payer: Self-pay | Admitting: Gynecology

## 2013-02-27 ENCOUNTER — Other Ambulatory Visit: Payer: Self-pay

## 2013-03-07 ENCOUNTER — Ambulatory Visit (INDEPENDENT_AMBULATORY_CARE_PROVIDER_SITE_OTHER): Payer: BC Managed Care – PPO | Admitting: Women's Health

## 2013-03-07 ENCOUNTER — Encounter: Payer: Self-pay | Admitting: Women's Health

## 2013-03-07 DIAGNOSIS — N899 Noninflammatory disorder of vagina, unspecified: Secondary | ICD-10-CM

## 2013-03-07 DIAGNOSIS — A499 Bacterial infection, unspecified: Secondary | ICD-10-CM

## 2013-03-07 DIAGNOSIS — N760 Acute vaginitis: Secondary | ICD-10-CM

## 2013-03-07 DIAGNOSIS — N898 Other specified noninflammatory disorders of vagina: Secondary | ICD-10-CM

## 2013-03-07 DIAGNOSIS — B9689 Other specified bacterial agents as the cause of diseases classified elsewhere: Secondary | ICD-10-CM

## 2013-03-07 LAB — URINALYSIS W MICROSCOPIC + REFLEX CULTURE
Bilirubin Urine: NEGATIVE
Hgb urine dipstick: NEGATIVE
Ketones, ur: NEGATIVE mg/dL
Nitrite: NEGATIVE
Protein, ur: NEGATIVE mg/dL
Specific Gravity, Urine: 1.025 (ref 1.005–1.030)
Urobilinogen, UA: 0.2 mg/dL (ref 0.0–1.0)

## 2013-03-07 LAB — WET PREP FOR TRICH, YEAST, CLUE
WBC, Wet Prep HPF POC: NONE SEEN
Yeast Wet Prep HPF POC: NONE SEEN

## 2013-03-07 MED ORDER — FLUCONAZOLE 150 MG PO TABS: 150.0000 mg | ORAL_TABLET | Freq: Once | ORAL | Status: DC

## 2013-03-07 MED ORDER — METRONIDAZOLE 0.75 % VA GEL: VAGINAL | Status: DC

## 2013-03-07 NOTE — Progress Notes (Signed)
Patient ID: Adriana Brown, female   DOB: 07-12-1970, 42 y.o.   MRN: [id number] Presents with complaint of vaginal itching, irritation and burning with urination at initiation. Has used over-the-counter Monistat with some relief. Denies vaginal odor, pain at end of stream of urination. Amenorrheic, Mirena IUD 10/2010.  Exam: Appears well. External genitalia erythematous at introitus, speculum exam moderate amount of a white cream, vaginal walls erythema, wet prep positive for clues, and TNTC bacteria. No odor noted.  Bacteria vaginosis  Plan: Diflucan 150 by mouth times one dose. MetroGel vaginal cream 1 applicator at bedtime x5, alcohol precautions reviewed. Instructed to call if no relief of symptoms.

## 2013-03-18 ENCOUNTER — Ambulatory Visit (INDEPENDENT_AMBULATORY_CARE_PROVIDER_SITE_OTHER): Payer: BC Managed Care – PPO

## 2013-03-18 ENCOUNTER — Other Ambulatory Visit: Payer: Self-pay | Admitting: Women's Health

## 2013-03-18 ENCOUNTER — Ambulatory Visit (INDEPENDENT_AMBULATORY_CARE_PROVIDER_SITE_OTHER): Payer: BC Managed Care – PPO | Admitting: Women's Health

## 2013-03-18 ENCOUNTER — Encounter: Payer: Self-pay | Admitting: Women's Health

## 2013-03-18 DIAGNOSIS — N8300 Follicular cyst of ovary, unspecified side: Secondary | ICD-10-CM

## 2013-03-18 DIAGNOSIS — N852 Hypertrophy of uterus: Secondary | ICD-10-CM

## 2013-03-18 DIAGNOSIS — N831 Corpus luteum cyst of ovary, unspecified side: Secondary | ICD-10-CM

## 2013-03-18 DIAGNOSIS — R109 Unspecified abdominal pain: Secondary | ICD-10-CM

## 2013-03-18 DIAGNOSIS — IMO0002 Reserved for concepts with insufficient information to code with codable children: Secondary | ICD-10-CM

## 2013-03-18 DIAGNOSIS — N898 Other specified noninflammatory disorders of vagina: Secondary | ICD-10-CM

## 2013-03-18 LAB — WET PREP FOR TRICH, YEAST, CLUE
Clue Cells Wet Prep HPF POC: NONE SEEN
WBC, Wet Prep HPF POC: NONE SEEN
Yeast Wet Prep HPF POC: NONE SEEN

## 2013-03-18 LAB — URINALYSIS W MICROSCOPIC + REFLEX CULTURE
Bilirubin Urine: NEGATIVE
Glucose, UA: NEGATIVE mg/dL
Hgb urine dipstick: NEGATIVE
Ketones, ur: NEGATIVE mg/dL
Leukocytes, UA: NEGATIVE
Nitrite: NEGATIVE
Protein, ur: NEGATIVE mg/dL
Specific Gravity, Urine: 1.02 (ref 1.005–1.030)
Urobilinogen, UA: 0.2 mg/dL (ref 0.0–1.0)
pH: 7 (ref 5.0–8.0)

## 2013-03-18 NOTE — Progress Notes (Signed)
Patient ID: Adriana Brown, female   DOB: 10/06/1970, 42 y.o.   MRN: [id number] Presents with low abdominal/vaginal pressure, dyspareunia and states something just does not feel right.. Was treated for BV 10/14 with MetroGel and did get relief of discharge. Mirena IUD/10/2010 amenorrheic. Denies discharge, urinary symptoms or fever.  Exam: Appears well. External genitalia within normal limits, speculum exam scant white discharge IUD strings not visible, wet prep negative. Bimanual no CMT or adnexal tenderness or fullness. UA: Negative Ultrasound: anteflex uterus enlarged homogeneous, endometrium 3 mm  IUD seen normal position, right ovary normal. Left ovary thickwalled corpus luteum cyst 17 x 19 x 60 mm. Negative cul-de-sac.  Pelvic/vaginal pressure  Plan: Reassured normal wet prep, UA, ultrasound. Will watch at this time, if  pressure persists instructed to call.

## 2013-03-19 ENCOUNTER — Other Ambulatory Visit: Payer: Self-pay | Admitting: Women's Health

## 2013-03-19 DIAGNOSIS — N852 Hypertrophy of uterus: Secondary | ICD-10-CM

## 2013-03-19 DIAGNOSIS — R109 Unspecified abdominal pain: Secondary | ICD-10-CM

## 2013-03-19 DIAGNOSIS — N831 Corpus luteum cyst of ovary, unspecified side: Secondary | ICD-10-CM

## 2013-03-25 ENCOUNTER — Other Ambulatory Visit: Payer: Self-pay | Admitting: Women's Health

## 2013-03-25 DIAGNOSIS — R109 Unspecified abdominal pain: Secondary | ICD-10-CM

## 2013-03-25 DIAGNOSIS — N852 Hypertrophy of uterus: Secondary | ICD-10-CM

## 2013-03-25 DIAGNOSIS — N831 Corpus luteum cyst of ovary, unspecified side: Secondary | ICD-10-CM

## 2014-01-20 ENCOUNTER — Ambulatory Visit (INDEPENDENT_AMBULATORY_CARE_PROVIDER_SITE_OTHER): Payer: BC Managed Care – PPO | Admitting: Gynecology

## 2014-01-20 ENCOUNTER — Encounter: Payer: Self-pay | Admitting: Gynecology

## 2014-01-20 ENCOUNTER — Other Ambulatory Visit (HOSPITAL_COMMUNITY)
Admission: RE | Admit: 2014-01-20 | Discharge: 2014-01-20 | Disposition: A | Discharge status: Home / Self Care | Payer: BC Managed Care – PPO | Source: Ambulatory Visit | Attending: Gynecology | Admitting: Gynecology

## 2014-01-20 VITALS — BP 138/82 | Ht 62.0 in | Wt 179.0 lb

## 2014-01-20 DIAGNOSIS — Z01419 Encounter for gynecological examination (general) (routine) without abnormal findings: Secondary | ICD-10-CM | POA: Insufficient documentation

## 2014-01-20 DIAGNOSIS — Z30431 Encounter for routine checking of intrauterine contraceptive device: Secondary | ICD-10-CM

## 2014-01-20 DIAGNOSIS — Z1151 Encounter for screening for human papillomavirus (HPV): Secondary | ICD-10-CM | POA: Diagnosis present

## 2014-01-20 NOTE — Patient Instructions (Signed)
You may obtain a copy of any labs that were done today by logging onto MyChart as outlined in the instructions provided with your AVS (after visit summary). The office will not call with normal lab results but certainly if there are any significant abnormalities then we will contact you.   Health Maintenance, Female A healthy lifestyle and preventative care can promote health and wellness.  Maintain regular health, dental, and eye exams.  Eat a healthy diet. Foods like vegetables, fruits, whole grains, low-fat dairy products, and lean protein foods contain the nutrients you need without too many calories. Decrease your intake of foods high in solid fats, added sugars, and salt. Get information about a proper diet from your caregiver, if necessary.  Regular physical exercise is one of the most important things you can do for your health. Most adults should get at least 150 minutes of moderate-intensity exercise (any activity that increases your heart rate and causes you to sweat) each week. In addition, most adults need muscle-strengthening exercises on 2 or more days a week.   Maintain a healthy weight. The body mass index (BMI) is a screening tool to identify possible weight problems. It provides an estimate of body fat based on height and weight. Your caregiver can help determine your BMI, and can help you achieve or maintain a healthy weight. For adults 20 years and older:  A BMI below 18.5 is considered underweight.  A BMI of 18.5 to 24.9 is normal.  A BMI of 25 to 29.9 is considered overweight.  A BMI of 30 and above is considered obese.  Maintain normal blood lipids and cholesterol by exercising and minimizing your intake of saturated fat. Eat a balanced diet with plenty of fruits and vegetables. Blood tests for lipids and cholesterol should begin at age 61 and be repeated every 5 years. If your lipid or cholesterol levels are high, you are over 50, or you are a high risk for heart  disease, you may need your cholesterol levels checked more frequently.Ongoing high lipid and cholesterol levels should be treated with medicines if diet and exercise are not effective.  If you smoke, find out from your caregiver how to quit. If you do not use tobacco, do not start.  Lung cancer screening is recommended for adults aged 33 80 years who are at high risk for developing lung cancer because of a history of smoking. Yearly low-dose computed tomography (CT) is recommended for people who have at least a 30-pack-year history of smoking and are a current smoker or have quit within the past 15 years. A pack year of smoking is smoking an average of 1 pack of cigarettes a day for 1 year (for example: 1 pack a day for 30 years or 2 packs a day for 15 years). Yearly screening should continue until the smoker has stopped smoking for at least 15 years. Yearly screening should also be stopped for people who develop a health problem that would prevent them from having lung cancer treatment.  If you are pregnant, do not drink alcohol. If you are breastfeeding, be very cautious about drinking alcohol. If you are not pregnant and choose to drink alcohol, do not exceed 1 drink per day. One drink is considered to be 12 ounces (355 mL) of beer, 5 ounces (148 mL) of wine, or 1.5 ounces (44 mL) of liquor.  Avoid use of street drugs. Do not share needles with anyone. Ask for help if you need support or instructions about stopping  the use of drugs.  High blood pressure causes heart disease and increases the risk of stroke. Blood pressure should be checked at least every 1 to 2 years. Ongoing high blood pressure should be treated with medicines, if weight loss and exercise are not effective.  If you are 59 to 41 years old, ask your caregiver if you should take aspirin to prevent strokes.  Diabetes screening involves taking a blood sample to check your fasting blood sugar level. This should be done once every 3  years, after age 91, if you are within normal weight and without risk factors for diabetes. Testing should be considered at a younger age or be carried out more frequently if you are overweight and have at least 1 risk factor for diabetes.  Breast cancer screening is essential preventative care for women. You should practice "breast self-awareness." This means understanding the normal appearance and feel of your breasts and may include breast self-examination. Any changes detected, no matter how small, should be reported to a caregiver. Women in their 66s and 30s should have a clinical breast exam (CBE) by a caregiver as part of a regular health exam every 1 to 3 years. After age 101, women should have a CBE every year. Starting at age 100, women should consider having a mammogram (breast X-ray) every year. Women who have a family history of breast cancer should talk to their caregiver about genetic screening. Women at a high risk of breast cancer should talk to their caregiver about having an MRI and a mammogram every year.  Breast cancer gene (BRCA)-related cancer risk assessment is recommended for women who have family members with BRCA-related cancers. BRCA-related cancers include breast, ovarian, tubal, and peritoneal cancers. Having family members with these cancers may be associated with an increased risk for harmful changes (mutations) in the breast cancer genes BRCA1 and BRCA2. Results of the assessment will determine the need for genetic counseling and BRCA1 and BRCA2 testing.  The Pap test is a screening test for cervical cancer. Women should have a Pap test starting at age 57. Between ages 25 and 35, Pap tests should be repeated every 2 years. Beginning at age 37, you should have a Pap test every 3 years as long as the past 3 Pap tests have been normal. If you had a hysterectomy for a problem that was not cancer or a condition that could lead to cancer, then you no longer need Pap tests. If you are  between ages 50 and 76, and you have had normal Pap tests going back 10 years, you no longer need Pap tests. If you have had past treatment for cervical cancer or a condition that could lead to cancer, you need Pap tests and screening for cancer for at least 20 years after your treatment. If Pap tests have been discontinued, risk factors (such as a new sexual partner) need to be reassessed to determine if screening should be resumed. Some women have medical problems that increase the chance of getting cervical cancer. In these cases, your caregiver may recommend more frequent screening and Pap tests.  The human papillomavirus (HPV) test is an additional test that may be used for cervical cancer screening. The HPV test looks for the virus that can cause the cell changes on the cervix. The cells collected during the Pap test can be tested for HPV. The HPV test could be used to screen women aged 44 years and older, and should be used in women of any age  who have unclear Pap test results. After the age of 55, women should have HPV testing at the same frequency as a Pap test.  Colorectal cancer can be detected and often prevented. Most routine colorectal cancer screening begins at the age of 44 and continues through age 20. However, your caregiver may recommend screening at an earlier age if you have risk factors for colon cancer. On a yearly basis, your caregiver may provide home test kits to check for hidden blood in the stool. Use of a small camera at the end of a tube, to directly examine the colon (sigmoidoscopy or colonoscopy), can detect the earliest forms of colorectal cancer. Talk to your caregiver about this at age 86, when routine screening begins. Direct examination of the colon should be repeated every 5 to 10 years through age 13, unless early forms of pre-cancerous polyps or small growths are found.  Hepatitis C blood testing is recommended for all people born from [birth year] through 1963 and any  individual with known risks for hepatitis C.  Practice safe sex. Use condoms and avoid high-risk sexual practices to reduce the spread of sexually transmitted infections (STIs). Sexually active women aged 36 and younger should be checked for Chlamydia, which is a common sexually transmitted infection. Older women with new or multiple partners should also be tested for Chlamydia. Testing for other STIs is recommended if you are sexually active and at increased risk.  Osteoporosis is a disease in which the bones lose minerals and strength with aging. This can result in serious bone fractures. The risk of osteoporosis can be identified using a bone density scan. Women ages 20 and over and women at risk for fractures or osteoporosis should discuss screening with their caregivers. Ask your caregiver whether you should be taking a calcium supplement or vitamin D to reduce the rate of osteoporosis.  Menopause can be associated with physical symptoms and risks. Hormone replacement therapy is available to decrease symptoms and risks. You should talk to your caregiver about whether hormone replacement therapy is right for you.  Use sunscreen. Apply sunscreen liberally and repeatedly throughout the day. You should seek shade when your shadow is shorter than you. Protect yourself by wearing long sleeves, pants, a wide-brimmed hat, and sunglasses year round, whenever you are outdoors.  Notify your caregiver of new moles or changes in moles, especially if there is a change in shape or color. Also notify your caregiver if a mole is larger than the size of a pencil eraser.  Stay current with your immunizations. Document Released: 10/24/2010 Document Revised: 08/05/2012 Document Reviewed: 10/24/2010 [facility] Patient Information 2014 [employer].

## 2014-01-20 NOTE — Progress Notes (Signed)
Adriana Brown 02-06-1971 [id number]        43 y.o.  G2P2002 for annual exam.  Several issues noted below.  Past medical history,surgical history, problem list, medications, allergies, family history and social history were all reviewed and documented as reviewed in the EPIC chart.  ROS:  12 system ROS performed with pertinent positives and negatives included in the history, assessment and plan.   Additional significant findings :  none   Exam: Kim Counsellor Vitals:   01/20/14 1511  BP: 138/82  Height: 5\' 2"  (1.575 m)  Weight: 179 lb (81.194 kg)   General appearance:  Normal affect, orientation and appearance. Skin: Grossly normal HEENT: Without gross lesions.  No cervical or supraclavicular adenopathy. Thyroid normal.  Lungs:  Clear without wheezing, rales or rhonchi Cardiac: RR, without RMG Abdominal:  Soft, nontender, without masses, guarding, rebound, organomegaly or hernia Breasts:  Examined lying and sitting without masses, retractions, discharge or axillary adenopathy. Pelvic:  Ext/BUS/vagina normal  Cervix normal. IUD strings visualized. Pap/HPV  Uterus anteverted, normal size, shape and contour, midline and mobile nontender   Adnexa  Without masses or tenderness    Anus and perineum  Normal   Rectovaginal  Normal sphincter tone without palpated masses or tenderness.    Assessment/Plan:  43 y.o. G67P2002 female for annual exam without menses, Mirena IUD.   1. Mirena IUD 10/2010. Doing well without menses. Continue to monitor. 2. Pap smear 2012. Pap/HPV today.  No history of abnormal Pap smears previously. 3. Mammography 01/2013. Has scheduled in October. SBE monthly reviewed. 4. Health maintenance. Recently had full lab work panel at her primary physician's office. Follow up in one year, sooner as needed.     Anastasio Auerbach MD, 3:47 PM 01/20/2014

## 2014-01-22 LAB — CYTOLOGY - PAP

## 2014-02-02 ENCOUNTER — Encounter: Payer: Self-pay | Admitting: Gynecology

## 2014-02-23 ENCOUNTER — Encounter: Payer: Self-pay | Admitting: Gynecology

## 2015-01-22 ENCOUNTER — Encounter: Payer: Self-pay | Admitting: Gynecology

## 2015-01-22 ENCOUNTER — Ambulatory Visit (INDEPENDENT_AMBULATORY_CARE_PROVIDER_SITE_OTHER): Payer: BC Managed Care – PPO | Admitting: Gynecology

## 2015-01-22 VITALS — BP 120/78 | Ht 62.0 in | Wt 193.0 lb

## 2015-01-22 DIAGNOSIS — Z30431 Encounter for routine checking of intrauterine contraceptive device: Secondary | ICD-10-CM | POA: Diagnosis not present

## 2015-01-22 DIAGNOSIS — Z01419 Encounter for gynecological examination (general) (routine) without abnormal findings: Secondary | ICD-10-CM

## 2015-01-22 NOTE — Progress Notes (Signed)
Adriana Brown Sep 08, 1970 [id number]        44 y.o.  G2P2002 for annual exam.  Doing well without complaints.  Past medical history,surgical history, problem list, medications, allergies, family history and social history were all reviewed and documented as reviewed in the EPIC chart.  ROS:  Performed with pertinent positives and negatives included in the history, assessment and plan.   Additional significant findings :  none   Exam: Kim Counsellor Vitals:   01/22/15 1534  BP: 120/78  Height: 5\' 2"  (1.575 m)  Weight: 193 lb (87.544 kg)   General appearance:  Normal affect, orientation and appearance. Skin: Grossly normal HEENT: Without gross lesions.  No cervical or supraclavicular adenopathy. Thyroid normal.  Lungs:  Clear without wheezing, rales or rhonchi Cardiac: RR, without RMG Abdominal:  Soft, nontender, without masses, guarding, rebound, organomegaly or hernia Breasts:  Examined lying and sitting without masses, retractions, discharge or axillary adenopathy. Pelvic:  Ext/BUS/vagina normal  Cervix normal. IUD string not visualized grossly. Not visualized with colposcope and endocervical speculum  Uterus anteverted, normal size, shape and contour, midline and mobile nontender   Adnexa  Without masses or tenderness    Anus and perineum  Normal   Rectovaginal  Normal sphincter tone without palpated masses or tenderness.    Assessment/Plan:  44 y.o. G89P2002 female for annual exam without menses, Mirena IUD.   1. Mirena IUD 10/2010. IUD string not visualized. Recommend ultrasound for placement as it was visualized over the last 2 years. Due to have IUD replaced July 2017. Reminded patient to make this appointment. 2. Mammography due next month and patient will schedule. SBE monthly reviewed. 3. Pap smears/HPV 2015 normal. No Pap smear done today. No history of abnormal Pap smears previously. 4. Health maintenance. No routine lab work done as patient reports this done at  her primary physician's office. Follow up 1 year, sooner as needed.   Anastasio Auerbach MD, 3:59 PM 01/22/2015

## 2015-01-22 NOTE — Patient Instructions (Signed)
Follow up for ultrasound is scheduled for IUD placement.  Follow up in July 2017 to have your IUD replaced.  You may obtain a copy of any labs that were done today by logging onto MyChart as outlined in the instructions provided with your AVS (after visit summary). The office will not call with normal lab results but certainly if there are any significant abnormalities then we will contact you.   Health Maintenance Adopting a healthy lifestyle and getting preventive care can go a long way to promote health and wellness. Talk with your health care provider about what schedule of regular examinations is right for you. This is a good chance for you to check in with your provider about disease prevention and staying healthy. In between checkups, there are plenty of things you can do on your own. Experts have done a lot of research about which lifestyle changes and preventive measures are most likely to keep you healthy. Ask your health care provider for more information. WEIGHT AND DIET  Eat a healthy diet  Be sure to include plenty of vegetables, fruits, low-fat dairy products, and lean protein.  Do not eat a lot of foods high in solid fats, added sugars, or salt.  Get regular exercise. This is one of the most important things you can do for your health.  Most adults should exercise for at least 150 minutes each week. The exercise should increase your heart rate and make you sweat (moderate-intensity exercise).  Most adults should also do strengthening exercises at least twice a week. This is in addition to the moderate-intensity exercise.  Maintain a healthy weight  Body mass index (BMI) is a measurement that can be used to identify possible weight problems. It estimates body fat based on height and weight. Your health care provider can help determine your BMI and help you achieve or maintain a healthy weight.  For females 69 years of age and older:   A BMI below 18.5 is considered  underweight.  A BMI of 18.5 to 24.9 is normal.  A BMI of 25 to 29.9 is considered overweight.  A BMI of 30 and above is considered obese.  Watch levels of cholesterol and blood lipids  You should start having your blood tested for lipids and cholesterol at 42 years of age, then have this test every 5 years.  You may need to have your cholesterol levels checked more often if:  Your lipid or cholesterol levels are high.  You are older than 42 years of age.  You are at high risk for heart disease.  CANCER SCREENING   Lung Cancer  Lung cancer screening is recommended for adults 52-33 years old who are at high risk for lung cancer because of a history of smoking.  A yearly low-dose CT scan of the lungs is recommended for people who:  Currently smoke.  Have quit within the past 15 years.  Have at least a 30-pack-year history of smoking. A pack year is smoking an average of one pack of cigarettes a day for 1 year.  Yearly screening should continue until it has been 15 years since you quit.  Yearly screening should stop if you develop a health problem that would prevent you from having lung cancer treatment.  Breast Cancer  Practice breast self-awareness. This means understanding how your breasts normally appear and feel.  It also means doing regular breast self-exams. Let your health care provider know about any changes, no matter how small.  If you  are in your 20s or 30s, you should have a clinical breast exam (CBE) by a health care provider every 1-3 years as part of a regular health exam.  If you are 44 or older, have a CBE every year. Also consider having a breast X-ray (mammogram) every year.  If you have a family history of breast cancer, talk to your health care provider about genetic screening.  If you are at high risk for breast cancer, talk to your health care provider about having an MRI and a mammogram every year.  Breast cancer gene (BRCA) assessment is  recommended for women who have family members with BRCA-related cancers. BRCA-related cancers include:  Breast.  Ovarian.  Tubal.  Peritoneal cancers.  Results of the assessment will determine the need for genetic counseling and BRCA1 and BRCA2 testing. Cervical Cancer Routine pelvic examinations to screen for cervical cancer are no longer recommended for nonpregnant women who are considered low risk for cancer of the pelvic organs (ovaries, uterus, and vagina) and who do not have symptoms. A pelvic examination may be necessary if you have symptoms including those associated with pelvic infections. Ask your health care provider if a screening pelvic exam is right for you.   The Pap test is the screening test for cervical cancer for women who are considered at risk.  If you had a hysterectomy for a problem that was not cancer or a condition that could lead to cancer, then you no longer need Pap tests.  If you are older than 65 years, and you have had normal Pap tests for the past 10 years, you no longer need to have Pap tests.  If you have had past treatment for cervical cancer or a condition that could lead to cancer, you need Pap tests and screening for cancer for at least 20 years after your treatment.  If you no longer get a Pap test, assess your risk factors if they change (such as having a new sexual partner). This can affect whether you should start being screened again.  Some women have medical problems that increase their chance of getting cervical cancer. If this is the case for you, your health care provider may recommend more frequent screening and Pap tests.  The human papillomavirus (HPV) test is another test that may be used for cervical cancer screening. The HPV test looks for the virus that can cause cell changes in the cervix. The cells collected during the Pap test can be tested for HPV.  The HPV test can be used to screen women 4 years of age and older. Getting tested  for HPV can extend the interval between normal Pap tests from three to five years.  An HPV test also should be used to screen women of any age who have unclear Pap test results.  After 42 years of age, women should have HPV testing as often as Pap tests.  Colorectal Cancer  This type of cancer can be detected and often prevented.  Routine colorectal cancer screening usually begins at 42 years of age and continues through 42 years of age.  Your health care provider may recommend screening at an earlier age if you have risk factors for colon cancer.  Your health care provider may also recommend using home test kits to check for hidden blood in the stool.  A small camera at the end of a tube can be used to examine your colon directly (sigmoidoscopy or colonoscopy). This is done to check for the earliest  forms of colorectal cancer.  Routine screening usually begins at age 49.  Direct examination of the colon should be repeated every 5-10 years through 42 years of age. However, you may need to be screened more often if early forms of precancerous polyps or small growths are found. Skin Cancer  Check your skin from head to toe regularly.  Tell your health care provider about any new moles or changes in moles, especially if there is a change in a mole's shape or color.  Also tell your health care provider if you have a mole that is larger than the size of a pencil eraser.  Always use sunscreen. Apply sunscreen liberally and repeatedly throughout the day.  Protect yourself by wearing long sleeves, pants, a wide-brimmed hat, and sunglasses whenever you are outside. HEART DISEASE, DIABETES, AND HIGH BLOOD PRESSURE   Have your blood pressure checked at least every 1-2 years. High blood pressure causes heart disease and increases the risk of stroke.  If you are between 48 years and 28 years old, ask your health care provider if you should take aspirin to prevent strokes.  Have regular  diabetes screenings. This involves taking a blood sample to check your fasting blood sugar level.  If you are at a normal weight and have a low risk for diabetes, have this test once every three years after 42 years of age.  If you are overweight and have a high risk for diabetes, consider being tested at a younger age or more often. PREVENTING INFECTION  Hepatitis B  If you have a higher risk for hepatitis B, you should be screened for this virus. You are considered at high risk for hepatitis B if:  You were born in a country where hepatitis B is common. Ask your health care provider which countries are considered high risk.  Your parents were born in a high-risk country, and you have not been immunized against hepatitis B (hepatitis B vaccine).  You have HIV or AIDS.  You use needles to inject street drugs.  You live with someone who has hepatitis B.  You have had sex with someone who has hepatitis B.  You get hemodialysis treatment.  You take certain medicines for conditions, including cancer, organ transplantation, and autoimmune conditions. Hepatitis C  Blood testing is recommended for:  Everyone born from [birth year] through 1963.  Anyone with known risk factors for hepatitis C. Sexually transmitted infections (STIs)  You should be screened for sexually transmitted infections (STIs) including gonorrhea and chlamydia if:  You are sexually active and are younger than 42 years of age.  You are older than 42 years of age and your health care provider tells you that you are at risk for this type of infection.  Your sexual activity has changed since you were last screened and you are at an increased risk for chlamydia or gonorrhea. Ask your health care provider if you are at risk.  If you do not have HIV, but are at risk, it may be recommended that you take a prescription medicine daily to prevent HIV infection. This is called pre-exposure prophylaxis (PrEP). You are considered at  risk if:  You are sexually active and do not regularly use condoms or know the HIV status of your partner(s).  You take drugs by injection.  You are sexually active with a partner who has HIV. Talk with your health care provider about whether you are at high risk of being infected with HIV. If you choose to  begin PrEP, you should first be tested for HIV. You should then be tested every 3 months for as long as you are taking PrEP.  PREGNANCY   If you are premenopausal and you may become pregnant, ask your health care provider about preconception counseling.  If you may become pregnant, take 400 to 800 micrograms (mcg) of folic acid every day.  If you want to prevent pregnancy, talk to your health care provider about birth control (contraception). OSTEOPOROSIS AND MENOPAUSE   Osteoporosis is a disease in which the bones lose minerals and strength with aging. This can result in serious bone fractures. Your risk for osteoporosis can be identified using a bone density scan.  If you are 19 years of age or older, or if you are at risk for osteoporosis and fractures, ask your health care provider if you should be screened.  Ask your health care provider whether you should take a calcium or vitamin D supplement to lower your risk for osteoporosis.  Menopause may have certain physical symptoms and risks.  Hormone replacement therapy may reduce some of these symptoms and risks. Talk to your health care provider about whether hormone replacement therapy is right for you.  HOME CARE INSTRUCTIONS   Schedule regular health, dental, and eye exams.  Stay current with your immunizations.   Do not use any tobacco products including cigarettes, chewing tobacco, or electronic cigarettes.  If you are pregnant, do not drink alcohol.  If you are breastfeeding, limit how much and how often you drink alcohol.  Limit alcohol intake to no more than 1 drink per day for nonpregnant women. One drink equals  12 ounces of beer, 5 ounces of wine, or 1 ounces of hard liquor.  Do not use street drugs.  Do not share needles.  Ask your health care provider for help if you need support or information about quitting drugs.  Tell your health care provider if you often feel depressed.  Tell your health care provider if you have ever been abused or do not feel safe at home. Document Released: 10/24/2010 Document Revised: 08/25/2013 Document Reviewed: 03/12/2013 [facility] Patient Information 2015 [place], [place]. This information is not intended to replace advice given to you by your health care provider. Make sure you discuss any questions you have with your health care provider.

## 2015-02-17 ENCOUNTER — Ambulatory Visit (INDEPENDENT_AMBULATORY_CARE_PROVIDER_SITE_OTHER): Payer: BC Managed Care – PPO

## 2015-02-17 ENCOUNTER — Encounter: Payer: Self-pay | Admitting: Gynecology

## 2015-02-17 ENCOUNTER — Ambulatory Visit (INDEPENDENT_AMBULATORY_CARE_PROVIDER_SITE_OTHER): Payer: BC Managed Care – PPO | Admitting: Gynecology

## 2015-02-17 VITALS — BP 130/80

## 2015-02-17 DIAGNOSIS — Z30431 Encounter for routine checking of intrauterine contraceptive device: Secondary | ICD-10-CM

## 2015-02-17 NOTE — Progress Notes (Signed)
Adriana Brown [birth year] [id number]        44 y.o.  G2P2002 presents for ultrasound due to unable to see her Mirena IUD string on annual exam.  Otherwise doing well without complaints  Past medical history,surgical history, problem list, medications, allergies, family history and social history were all reviewed and documented in the EPIC chart.  Directed ROS with pertinent positives and negatives documented in the history of present illness/assessment and plan.  Exam: Filed Vitals:   02/17/15 1637  BP: 130/80   General appearance:  Normal  Ultrasound shows uterus normal size and echotexture. Endometrial echo 2.9 mm. IUD visualized within the normal position. Right and left ovaries grossly normal. Cul-de-sac negative.  Assessment/Plan:  44 y.o. G2P2002 was normal ultrasound showing IUD in proper location. Patient is due to have the IUD replaced this coming July 2017 and I reminded her to schedule this. Follow up sooner if any issues.    Anastasio Auerbach MD, 4:47 PM 02/17/2015

## 2015-02-17 NOTE — Patient Instructions (Signed)
Follow up in July 2017 to have your IUD replaced

## 2015-07-23 ENCOUNTER — Other Ambulatory Visit: Payer: Self-pay | Admitting: Internal Medicine

## 2015-07-23 DIAGNOSIS — R51 Headache: Principal | ICD-10-CM

## 2015-07-23 DIAGNOSIS — R519 Headache, unspecified: Secondary | ICD-10-CM

## 2015-07-30 ENCOUNTER — Ambulatory Visit
Admission: RE | Admit: 2015-07-30 | Discharge: 2015-07-30 | Disposition: A | Discharge status: Home / Self Care | Payer: BC Managed Care – PPO | Source: Ambulatory Visit | Attending: Internal Medicine | Admitting: Internal Medicine

## 2015-07-30 DIAGNOSIS — R51 Headache: Principal | ICD-10-CM

## 2015-07-30 DIAGNOSIS — R519 Headache, unspecified: Secondary | ICD-10-CM

## 2015-09-07 ENCOUNTER — Encounter: Payer: Self-pay | Admitting: Neurology

## 2015-09-07 ENCOUNTER — Ambulatory Visit (INDEPENDENT_AMBULATORY_CARE_PROVIDER_SITE_OTHER): Payer: BC Managed Care – PPO | Admitting: Neurology

## 2015-09-07 VITALS — BP 130/88 | HR 78 | Resp 16 | Ht 62.0 in | Wt 181.0 lb

## 2015-09-07 DIAGNOSIS — G43009 Migraine without aura, not intractable, without status migrainosus: Secondary | ICD-10-CM | POA: Diagnosis not present

## 2015-09-07 MED ORDER — TOPIRAMATE 25 MG PO TABS: 50.0000 mg | ORAL_TABLET | Freq: Two times a day (BID) | ORAL | Status: DC

## 2015-09-07 NOTE — Progress Notes (Signed)
Subjective:    Patient ID: Adriana Brown is a 45 y.o. female.  HPI     Star Age, MD, PhD [facility] [address], [address] [place], [place] [address]  Dear Dr. Maudie Mercury,   I saw your patient, Adriana Brown, upon your kind request, in my neurologic clinic today for initial consultation of her headaches. The patient is unaccompanied today. As you know, Adriana Brown is a 45 year old right-handed woman with an underlying medical history of vitamin D deficiency, hypertension, hearing loss and b/l hearing aids (x 5 years or so) and obesity, who reports recurrent headaches for the past few weeks, probably for the past 2 1/2 months, mostly R sided, associated with some nausea, no vomiting, no significant photophobia, Not very severe, mostly dull and achy with fluctuation. She has a routine eye exam usually once a year and wears contacts. She has not found any significant triggers with the exception of sugary snacks such as donuts or cookie seem to trigger a headache, it comes and goes, is not typically debilitating, does not get to a point of 10 out of 10 but stays in the single digits, typically lower single digits is for severely, currently about 4 out of 10 she would estimate. She has had more stress lately.  she does not have overt family history of migraines or sleep apnea. She does not think she snores, husband has never complained but he himself is a loud snorer. Sometimes she wakes up with a headache, she has nocturia about twice per night on average. She has been more tired lately in the past couple weeks or 3 weeks. She has had no neurological accompaniment, in particular, no one-sided weakness, numbness, tingling, droopy face or slurring of speech is reported. She may have occasional blurry vision, no double vision, no visual field defect, no typical visual aura. 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 Lives with husband and 93 yo and 92 yo kids, non-smoker, no alcohol and occasional caffeine.   I reviewed your office note from 07/23/2015 as well as 08/20/2015, which you kindly included. She had blood work in your office including lipid panel, vitamin D level, CMP. We will request test results from your office. She was started on low-dose topiramate 25 mg twice daily in the beginning of April, she has felt it once since then, she feels that it has helped some.  She had a head CT without contrast on 07/30/2015: IMPRESSION: No acute intracranial abnormality. In addition, I personally reviewed the images through the PACS system.   Her Past Medical History Is Significant For: Past Medical History  Diagnosis Date  . Vitamin D deficiency 2013    26 /23.5  . Hypertension   . Pre-eclampsia   . Vitamin D deficiency   . HOH (hard of hearing)     Her Past Surgical History Is Significant For: Past Surgical History  Procedure Laterality Date  . Cesarean section  2005  . Cesarean section  2007  . Mirena      Inserted 11-17-10    Her Family History Is Significant For: Family History  Problem Relation Age of Onset  . Hypertension Mother   . Hypertension Sister   . Diabetes Sister   . Hypertension Brother   . Diabetes Maternal Aunt   . Hypertension Father     Her Social History Is Significant For: Social History   Social  History  . Marital Status: Married    Spouse Name: N/A  . Number of Children: 2  . Years of Education: college   Social History Main Topics  . Smoking status: Never Smoker   . Smokeless tobacco: Never Used  . Alcohol Use: No  . Drug Use: No  . Sexual Activity: Yes    Birth Control/ Protection: IUD     Comment: Mirena inserted 10-2010-1st intercourse 45 yo-Fewer than 5 partners   Other Topics Concern  . None   Social History Narrative   Occasional soda or chocolate     Her Allergies Are:  No Known Allergies:   Her Current Medications Are:   Outpatient Encounter Prescriptions as of 09/07/2015  Medication Sig  . Cholecalciferol (VITAMIN D PO) Take 2,000 Units by mouth daily.   . Multiple Vitamin (MULTIVITAMIN) capsule Take 1 capsule by mouth daily.    Marland Kitchen topiramate (TOPAMAX) 25 MG tablet Take 25 mg by mouth 2 (two) times daily.  . valsartan (DIOVAN) 80 MG tablet    Facility-Administered Encounter Medications as of 09/07/2015  Medication  . levonorgestrel (MIRENA) 20 MCG/24HR IUD  :  Review of Systems:  Out of a complete 14 point review of systems, all are reviewed and negative with the exception of these symptoms as listed below:  Review of Systems  Neurological:       Patient reports that she has had an off and on dull headache on the R side of her head for 2 and a half months. Has been relieved some with Topamax. Seems to be triggered by sugar.  Denies snoring or apneic events.     Objective:  Neurologic Exam  Physical Exam Physical Examination:   Filed Vitals:   09/07/15 1625  BP: 130/88  Pulse: 78  Resp: 16    General Examination: The patient is a very pleasant 45 y.o. female in no acute distress. She appears well-developed and well-nourished and very well groomed. She is mildly anxious appearing.   HEENT: Normocephalic, atraumatic, pupils are equal, round and reactive to light and accommodation. Funduscopic exam is normal with sharp disc margins noted. Extraocular tracking is good without limitation to gaze excursion or nystagmus noted. Normal smooth pursuit is noted. Hearing is grossly intact with b/l hearing aids in place. Face is symmetric with normal facial animation and normal facial sensation. Speech is clear with no dysarthria noted. There is no hypophonia. There is no lip, neck/head, jaw or voice tremor. Neck is supple with full range of passive and active motion. There are no carotid bruits on auscultation. Oropharynx exam reveals: mild mouth dryness, good dental hygiene and mild airway crowding, due to  smaller airway entry and tonsils of 1-2+ in place. Mallampati is class I. Tongue protrudes centrally and palate elevates symmetrically. Neck size is 14 1/8 inches. She has a Mild overbite.  Chest: Clear to auscultation without wheezing, rhonchi or crackles noted.  Heart: S1+S2+0, regular and normal without murmurs, rubs or gallops noted.   Abdomen: Soft, non-tender and non-distended with normal bowel sounds appreciated on auscultation.  Extremities: There is no pitting edema in the distal lower extremities bilaterally. Pedal pulses are intact.  Skin: Warm and dry without trophic changes noted. There are no varicose veins.  Musculoskeletal: exam reveals no obvious joint deformities, tenderness or joint swelling or erythema.   Neurologically:  Mental status: The patient is awake, alert and oriented in all 4 spheres. Her immediate and remote memory, attention, language skills and fund of knowledge are  appropriate. There is no evidence of aphasia, agnosia, apraxia or anomia. Speech is clear with normal prosody and enunciation. Thought process is linear. Mood is normal and affect is normal.  Cranial nerves II - XII are as described above under HEENT exam. In addition: shoulder shrug is normal with equal shoulder height noted. Motor exam: Normal bulk, strength and tone is noted. There is no drift, tremor or rebound. Romberg is negative. Reflexes are 2+ throughout. Babinski: Toes are flexor bilaterally. Fine motor skills and coordination: intact with normal finger taps, normal hand movements, normal rapid alternating patting, normal foot taps and normal foot agility.  Cerebellar testing: No dysmetria or intention tremor on finger to nose testing. Heel to shin is unremarkable bilaterally. There is no truncal or gait ataxia.  Sensory exam: intact to light touch, pinprick, vibration, temperature sense in the upper and lower extremities.  Gait, station and balance: She stands easily. No veering to one side  is noted. No leaning to one side is noted. Posture is age-appropriate and stance is narrow based. Gait shows normal stride length and normal pace. No problems turning are noted. Tandem walk is unremarkable.   Assessment and Plan:  In summary, JAQUIRA EMCH is a very pleasant 45 y.o.-year old female with an underlying medical history of vitamin D deficiency, hypertension, hearing loss and b/l hearing aids (x 5 years or so) and obesity, who Presents for initial consultation of a intermittent headache, recurrent for the past 2-1/2 months, mostly right sided, associated with some nausea, triggered by blood sugar fluctuation and stress perhaps. Her history is not telltale for migraines but mostly resembles migrainous headaches. Of note, she was started on low-dose topiramate 25 mg twice daily, and feels that it has helped some. She denies any sinister side effects. Her physical exam and neurological exam are nonfocal. I did ask her about symptoms of sleep apnea. She is not sure that there are any particular concerns, she does not think she snores. I've asked her to ask her husband about it as well. She does sometimes wake up with a headache, reports nocturia, and lately has noted some daytime tiredness, but has had more work-related stress lately because of end of the school year.  She had a recent head CT without contrast which I reviewed. I reassured her about the results.  I talked to the patient about symptomatic treatments and diagnostic tests today. I suggested we increase the topiramate to 50 mg twice daily. I talked to her about potential interaction with oral birth control pills, she is on an IUD. Also talked her about potential side effects including paresthesias, changes in taste, sedation, cognitive side effects. She seems to be tolerating the low-dose quite well. I did talk to her about potential weight loss as well with the medication. We may consider a sleep study down the [place]. For now, we will  increase Topamax and see how it goes. She is in agreement. I will see her back routinely in about 3 months, sooner if needed.    I advised the patient about common headache triggers: sleep deprivation, dehydration, overheating, stress, hypoglycemia or skipping meals and blood sugar fluctuations, excessive pain medications or excessive alcohol use or caffeine withdrawal. Some people have food triggers such as aged cheese, orange juice or chocolate, especially dark chocolate, or MSG (monosodium glutamate). She is to try to avoid these headache triggers as much possible. It may be helpful to keep a headache diary to figure out what makes Her headaches worse  or brings them on and what alleviates them. Some people report headache onset after exercise but studies have shown that regular exercise may actually prevent headaches from coming. If She has exercise-induced headaches, She is advised to drink plenty of fluid before and after exercising and that to not overdo it and to not overheat.  I answered all her questions today and the patient was in agreement with the above outlined plan.  Thank you very much for allowing me to participate in the care of this nice patient. If I can be of any further assistance to you please do not hesitate to call me at [phone].  Sincerely,   Star Age, MD, PhD

## 2015-09-07 NOTE — Patient Instructions (Addendum)
Please remember, common headache triggers are: sleep deprivation, dehydration, overheating, stress, hypoglycemia or skipping meals and blood sugar fluctuations, excessive pain medications or excessive alcohol use or caffeine withdrawal. Some people have food triggers such as aged cheese, orange juice or chocolate, especially dark chocolate, or MSG (monosodium glutamate). Try to avoid these headache triggers as much possible. It may be helpful to keep a headache diary to figure out what makes your headaches worse or brings them on and what alleviates them. Some people report headache onset after exercise but studies have shown that regular exercise may actually prevent headaches from coming. If you have exercise-induced headaches, please make sure that you drink plenty of fluid before and after exercising and that you do not over do it and do not overheat.  Please ask family and your husband if you snore and if so, how loud it is, and if you have breathing related issues in your sleep, such as: snorting sounds, choking sounds, pauses in your breathing or shallow breathing events. These may be symptoms of obstructive sleep apnea (OSA).   For now, let's increase your topiramate to 50 mg twice daily.   We may consider a sleep study.

## 2015-09-21 ENCOUNTER — Telehealth: Payer: Self-pay | Admitting: Gynecology

## 2015-09-21 NOTE — Telephone Encounter (Signed)
09/21/15-I LM VM that pt BC will cover the replacement of pt Mirena IUD for contraception at 100% , no copay, per John@BC -[id number]

## 2015-10-25 ENCOUNTER — Encounter: Payer: Self-pay | Admitting: Gynecology

## 2015-10-25 ENCOUNTER — Ambulatory Visit (INDEPENDENT_AMBULATORY_CARE_PROVIDER_SITE_OTHER): Payer: BC Managed Care – PPO | Admitting: Gynecology

## 2015-10-25 VITALS — BP 128/80 | Ht 62.0 in | Wt 181.0 lb

## 2015-10-25 DIAGNOSIS — Z30433 Encounter for removal and reinsertion of intrauterine contraceptive device: Secondary | ICD-10-CM | POA: Diagnosis not present

## 2015-10-25 NOTE — Patient Instructions (Signed)
Intrauterine Device Insertion Most often, an intrauterine device (IUD) is inserted into the uterus to prevent pregnancy. There are 2 types of IUDs available:  Copper IUD--This type of IUD creates an environment that is not favorable to sperm survival. The mechanism of action of the copper IUD is not known for certain. It can stay in place for 10 years.  Hormone IUD--This type of IUD contains the hormone progestin (synthetic progesterone). The progestin thickens the cervical mucus and prevents sperm from entering the uterus, and it also thins the uterine lining. There is no evidence that the hormone IUD prevents implantation. One hormone IUD can stay in place for up to 5 years, and a different hormone IUD can stay in place for up to 3 years. An IUD is the most cost-effective birth control if left in place for the full duration. It may be removed at any time. LET YOUR HEALTH CARE PROVIDER KNOW ABOUT:  Any allergies you have.  All medicines you are taking, including vitamins, herbs, eye drops, creams, and over-the-counter medicines.  Previous problems you or members of your family have had with the use of anesthetics.  Any blood disorders you have.  Previous surgeries you have had.  Possibility of pregnancy.  Medical conditions you have. RISKS AND COMPLICATIONS  Generally, intrauterine device insertion is a safe procedure. However, as with any procedure, complications can occur. Possible complications include:  Accidental puncture (perforation) of the uterus.  Accidental placement of the IUD either in the muscle layer of the uterus (myometrium) or outside the uterus. If this happens, the IUD can be found essentially floating around the bowels and must be taken out surgically.  The IUD may fall out of the uterus (expulsion). This is more common in women who have recently had a child.   Pregnancy in the fallopian tube (ectopic).  Pelvic inflammatory disease (PID), which is infection of  the uterus and fallopian tubes. The risk of PID is slightly increased in the first 20 days after the IUD is placed, but the overall risk is still very low. BEFORE THE PROCEDURE  Schedule the IUD insertion for when you will have your menstrual period or right after, to make sure you are not pregnant. Placement of the IUD is better tolerated shortly after a menstrual cycle.  You may need to take tests or be examined to make sure you are not pregnant.  You may be required to take a pregnancy test.  You may be required to get checked for sexually transmitted infections (STIs) prior to placement. Placing an IUD in someone who has an infection can make the infection worse.  You may be given a pain reliever to take 1 or 2 hours before the procedure.  An exam will be performed to determine the size and position of your uterus.  Ask your health care provider about changing or stopping your regular medicines. PROCEDURE   A tool (speculum) is placed in the vagina. This allows your health care provider to see the lower part of the uterus (cervix).  The cervix is prepped with a medicine that lowers the risk of infection.  You may be given a medicine to numb each side of the cervix (intracervical or paracervical block). This is used to block and control any discomfort with insertion.  A tool (uterine sound) is inserted into the uterus to determine the length of the uterine cavity and the direction the uterus may be tilted.  A slim instrument (IUD inserter) is inserted through the cervical   canal and into your uterus.  The IUD is placed in the uterine cavity and the insertion device is removed.  The nylon string that is attached to the IUD and used for eventual IUD removal is trimmed. It is trimmed so that it lays high in the vagina, just outside the cervix. AFTER THE PROCEDURE  You may have bleeding after the procedure. This is normal. It varies from light spotting for a few days to menstrual-like  bleeding.  You may have mild cramping.   This information is not intended to replace advice given to you by your health care provider. Make sure you discuss any questions you have with your health care provider.   Document Released: 12/07/2010 Document Revised: 01/29/2013 Document Reviewed: 09/29/2012 Elsevier Interactive Patient Education 2016 [employer]  

## 2015-10-25 NOTE — Progress Notes (Signed)
    Adriana Brown Aug 24, 1970 [id number]        45 y.o.  [id number]  presents for Mirena IUD removal and replacement. She has read through the booklet, has no contraindications and signed the consent form.  I reviewed the removal and insertional process with her as well as the risks to include infection, either immediate or long-term, uterine perforation or migration requiring surgery to remove, other complications such as pain, hormonal side effects and possibility of failure with subsequent pregnancy.   Exam with Wandra Scot assistant Filed Vitals:   10/25/15 1454  BP: 128/80  Height: 5\' 2"  (1.575 m)  Weight: 181 lb (82.101 kg)    Pelvic: External BUS vagina normal. Cervix normal With IUD string not visualized. Uterus anteverted normal size shape contour midline mobile nontender. Adnexa without masses or tenderness.  Procedure: The cervix was visualized with a speculum, the [facility] forcep was open and closed within the endocervical canal and the old Mirena IUD string was blindly grasped and the IUD removed, shown to the patient and discarded. The cervix was then cleansed with Betadine, anterior lip grasped with a single-tooth tenaculum, the uterus was sounded and a Mirena IUD was placed according to manufacturer's recommendations without difficulty. The strings were trimmed. The patient tolerated well and will follow up in one month for a postinsertional check.  Lot number:  [id number]    Anastasio Auerbach MD, 3:20 PM 10/25/2015

## 2015-10-27 ENCOUNTER — Encounter: Payer: Self-pay | Admitting: Gynecology

## 2015-11-22 ENCOUNTER — Ambulatory Visit: Payer: BC Managed Care – PPO | Admitting: Gynecology

## 2015-11-24 ENCOUNTER — Encounter: Payer: Self-pay | Admitting: Gynecology

## 2015-11-24 ENCOUNTER — Ambulatory Visit: Payer: BC Managed Care – PPO | Admitting: Gynecology

## 2015-11-24 ENCOUNTER — Ambulatory Visit (INDEPENDENT_AMBULATORY_CARE_PROVIDER_SITE_OTHER): Payer: BC Managed Care – PPO | Admitting: Gynecology

## 2015-11-24 VITALS — BP 124/80

## 2015-11-24 DIAGNOSIS — Z30431 Encounter for routine checking of intrauterine contraceptive device: Secondary | ICD-10-CM | POA: Diagnosis not present

## 2015-11-24 NOTE — Progress Notes (Signed)
    Adriana Brown May 09, 1970 [id number]        45 y.o.  [id number] presents for follow up IUD check. Had Mirena IUD replaced one month ago. Doing well without complaints.  Past medical history,surgical history, problem list, medications, allergies, family history and social history were all reviewed and documented in the EPIC chart.  Directed ROS with pertinent positives and negatives documented in the history of present illness/assessment and plan.  Exam: Adriana Brown assistant Vitals:   11/24/15 1611  BP: 124/80   General appearance:  Normal Abdomen soft nontender without masses guarding rebound Pelvic external BUS vagina normal. Cervix normal. IUD string not visualized. Uterus normal size midline mobile nontender. Adnexa without masses or tenderness.  Under colposcopic visualization the IUD string was visualized at the external cervical os.  Assessment/Plan:  45 y.o. [id number] with normal IUD follow up exam. Follow up in one year for annual exam, sooner if any issues    Anastasio Auerbach MD, 4:30 PM 11/24/2015

## 2015-11-24 NOTE — Patient Instructions (Signed)
Follow up in one year when due for annual exam. Sooner if any issues

## 2015-12-07 ENCOUNTER — Ambulatory Visit (INDEPENDENT_AMBULATORY_CARE_PROVIDER_SITE_OTHER): Payer: BC Managed Care – PPO | Admitting: Neurology

## 2015-12-07 ENCOUNTER — Encounter: Payer: Self-pay | Admitting: Neurology

## 2015-12-07 VITALS — BP 106/78 | HR 72 | Resp 16 | Ht 62.0 in | Wt 190.0 lb

## 2015-12-07 DIAGNOSIS — G43009 Migraine without aura, not intractable, without status migrainosus: Secondary | ICD-10-CM | POA: Diagnosis not present

## 2015-12-07 MED ORDER — TOPIRAMATE 50 MG PO TABS: 50.0000 mg | ORAL_TABLET | Freq: Two times a day (BID) | ORAL | 5 refills | Status: DC

## 2015-12-07 NOTE — Progress Notes (Signed)
Subjective:    Patient ID: Adriana Brown is a 45 y.o. female.  HPI     Interim history:  Adriana Brown is a 45 year old right-handed woman with an underlying medical history of vitamin D deficiency, hypertension, hearing loss and b/l hearing aids (x 5 years or so) and obesity, who presents for follow-up consultation of her recurrent headaches. The patient is accompanied by her 2 children today. I first met her on 09/07/2015 at the request of her primary care physician, at which time she reported a few week history of recurrent headaches, particularly right-sided associated with nausea, primarily dull and achy in nature. She had been started on low-dose topiramate and I suggested we increase this to 50 mg twice daily.  Today, 12/07/2015: She reports doing well. She has been able to tolerate topiramate 50 mg twice daily, she takes 2 pills twice a day of the 25 mg strength, denies any side effects and reports improved headaches. She has no additional interim medical history changes or medication changes.   Previously:   09/07/2015: She reports recurrent headaches for the past few weeks, probably for the past 2 1/2 months, mostly R sided, associated with some nausea, no vomiting, no significant photophobia, Not very severe, mostly dull and achy with fluctuation. She has a routine eye exam usually once a year and wears contacts. She has not found any significant triggers with the exception of sugary snacks such as donuts or cookie seem to trigger a headache, it comes and goes, is not typically debilitating, does not get to a point of 10 out of 10 but stays in the single digits, typically lower single digits is for severely, currently about 4 out of 10 she would estimate. She has had more stress lately.  she does not have overt family history of migraines or sleep apnea. She does not think she snores, husband has never complained but he himself is a loud snorer. Sometimes she wakes up with a headache, she  has nocturia about twice per night on average. She has been more tired lately in the past couple weeks or 3 weeks. She has had no neurological accompaniment, in particular, no one-sided weakness, numbness, tingling, droopy face or slurring of speech is reported. She may have occasional blurry vision, no double vision, no visual field defect, no typical visual aura. 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 Lives with husband and 70 yo and 50 yo kids, non-smoker, no alcohol and occasional caffeine.    I reviewed your office note from 07/23/2015 as well as 08/20/2015, which you kindly included. She had blood work in your office including lipid panel, vitamin D level, CMP. We will request test results from your office. She was started on low-dose topiramate 25 mg twice daily in the beginning of April, she has felt it once since then, she feels that it has helped some.   She had a head CT without contrast on 07/30/2015: IMPRESSION: No acute intracranial abnormality. In addition, I personally reviewed the images through the PACS system.    Her Past Medical History Is Significant For: Past Medical History:  Diagnosis Date  . HOH (hard of hearing)   . Hypertension   . Pre-eclampsia   . Vitamin D deficiency 2013   26 /23.5  . Vitamin D deficiency     Her Past Surgical History Is Significant For: Past Surgical History:  Procedure Laterality Date  . CESAREAN SECTION  2005  . CESAREAN SECTION  2007  . Mirena     replaced 10/25/2015    Her Family History Is Significant For: Family History  Problem Relation Age of Onset  . Hypertension Mother   . Hypertension Sister   . Diabetes Sister   . Hypertension Brother   . Diabetes Maternal Aunt   . Hypertension Father     Her Social History Is Significant For: Social History   Social History  . Marital status: Married    Spouse name: N/A  . Number of children: 2  . Years  of education: college   Social History Main Topics  . Smoking status: Never Smoker  . Smokeless tobacco: Never Used  . Alcohol use No  . Drug use: No  . Sexual activity: Yes    Birth control/ protection: IUD     Comment: Mirena inserted 10-25-2015-1st intercourse 45 yo-Fewer than 5 partners   Other Topics Concern  . None   Social History Narrative   Occasional soda or chocolate     Her Allergies Are:  No Known Allergies:   Her Current Medications Are:  Outpatient Encounter Prescriptions as of 12/07/2015  Medication Sig  . Cholecalciferol (VITAMIN D PO) Take 2,000 Units by mouth daily.   . Multiple Vitamin (MULTIVITAMIN) capsule Take 1 capsule by mouth daily.    Marland Kitchen topiramate (TOPAMAX) 25 MG tablet Take 2 tablets (50 mg total) by mouth 2 (two) times daily.  . valsartan (DIOVAN) 80 MG tablet    Facility-Administered Encounter Medications as of 12/07/2015  Medication  . levonorgestrel (MIRENA) 20 MCG/24HR IUD  :  Review of Systems:  Out of a complete 14 point review of systems, all are reviewed and negative with the exception of these symptoms as listed below: Review of Systems  Neurological:       Patient states that she is taking Topiramate 82m BID. Feels that it is controlling her migraines. No new concerns.      Objective:  Neurologic Exam  Physical Exam Physical Examination:   Vitals:   12/07/15 1131  BP: 106/78  Pulse: 72  Resp: 16    General Examination: The patient is a very pleasant 45y.o. female in no acute distress. She appears well-developed and well-nourished and very well groomed. She is in good spirits today.    HEENT: Normocephalic, atraumatic, pupils are equal, round and reactive to light and accommodation. Funduscopic exam is normal with sharp disc margins noted. Extraocular tracking is good without limitation to gaze excursion or nystagmus noted. Normal smooth pursuit is noted. Hearing is grossly intact with b/l hearing aids in place. Face is  symmetric with normal facial animation and normal facial sensation. Speech is clear with no dysarthria noted. There is no hypophonia. There is no lip, neck/head, jaw or voice tremor. Neck is supple with full range of passive and active motion. There are no carotid bruits on auscultation. Oropharynx exam reveals: mild mouth dryness, good dental hygiene and mild airway crowding, due to smaller airway entry and tonsils of 1-2+ in place. Mallampati is class I. Tongue protrudes centrally and palate elevates symmetrically.   Chest: Clear to auscultation without wheezing, rhonchi or crackles noted.  Heart: S1+S2+0, regular and normal without murmurs, rubs or gallops noted.   Abdomen: Soft, non-tender and non-distended with normal bowel sounds appreciated on auscultation.  Extremities: There is no pitting edema in the distal lower extremities bilaterally. Pedal pulses are intact.  Skin: Warm and dry without trophic changes noted. There are  no varicose veins.  Musculoskeletal: exam reveals no obvious joint deformities, tenderness or joint swelling or erythema.   Neurologically:  Mental status: The patient is awake, alert and oriented in all 4 spheres. Her immediate and remote memory, attention, language skills and fund of knowledge are appropriate. There is no evidence of aphasia, agnosia, apraxia or anomia. Speech is clear with normal prosody and enunciation. Thought process is linear. Mood is normal and affect is normal.  Cranial nerves II - XII are as described above under HEENT exam. In addition: shoulder shrug is normal with equal shoulder height noted. Motor exam: Normal bulk, strength and tone is noted. There is no drift, tremor or rebound. Romberg is negative. Reflexes are 2+ throughout. Babinski: Toes are flexor bilaterally. Fine motor skills and coordination: intact with normal finger taps, normal hand movements, normal rapid alternating patting, normal foot taps and normal foot agility.   Cerebellar testing: No dysmetria or intention tremor on finger to nose testing. Heel to shin is unremarkable bilaterally. There is no truncal or gait ataxia.  Sensory exam: intact to light touch, pinprick, vibration, temperature sense in the upper and lower extremities.  Gait, station and balance: She stands easily. No veering to one side is noted. No leaning to one side is noted. Posture is age-appropriate and stance is narrow based. Gait shows normal stride length and normal pace. Tandem walk is unremarkable.   Assessment and Plan:  In summary, Adriana Brown is a very pleasant 45 year old female with an underlying medical history of vitamin D deficiency, hypertension, hearing loss and b/l hearing aids (x 5 years or so) and obesity, who presents for follow-up consultation of her migrainous headaches. She has done better after increasing her topiramate from 25 mg twice a day to 50 mg twice a day with good tolerance reported and improvement in her headaches reported. Physical exam is stable. I suggested we switch her to topiramate 50 mg strength one tablet twice daily and I adjusted her prescription in that regard. She is doing well and can follow-up in 6 months, she can see one of our nurse practitioners at the time. I answered all her questions today and she was in agreement. She had a recent head CT without contrast which I have reviewed. I again talked to her about potential interaction with oral birth control pills, she is on an IUD. Also talked her about potential side effects including paresthesias, changes in taste, sedation, cognitive side effects. She seems to be tolerating the current dose quite well. I did talk to her about potential weight loss as well with the medication.  I again advised the patient about common headache triggers: sleep deprivation, dehydration, overheating, stress, hypoglycemia or skipping meals and blood sugar fluctuations, excessive pain medications or excessive alcohol  use or caffeine withdrawal. Some people have food triggers such as aged cheese, orange juice or chocolate, especially dark chocolate, or MSG (monosodium glutamate). She is to try to avoid these headache triggers as much possible. It may be helpful to keep a headache diary to figure out what makes Her headaches worse or brings them on and what alleviates them. Some people report headache onset after exercise but studies have shown that regular exercise may actually prevent headaches from coming. If She has exercise-induced headaches, She is advised to drink plenty of fluid before and after exercising and that to not overdo it and to not overheat. I spent 25 minutes in total face-to-face time with the patient, more than 50% of  which was spent in counseling and coordination of care, reviewing test results, reviewing medication and discussing or reviewing the diagnosis of recurrent migraines, the prognosis and treatment options.

## 2015-12-07 NOTE — Patient Instructions (Addendum)
Please remember, common headache triggers are: sleep deprivation, dehydration, overheating, stress, hypoglycemia or skipping meals and blood sugar fluctuations, excessive pain medications or excessive alcohol use or caffeine withdrawal. Some people have food triggers such as aged cheese, orange juice or chocolate, especially dark chocolate, or MSG (monosodium glutamate). Try to avoid these headache triggers as much possible. It may be helpful to keep a headache diary to figure out what makes your headaches worse or brings them on and what alleviates them. Some people report headache onset after exercise but studies have shown that regular exercise may actually prevent headaches from coming. If you have exercise-induced headaches, please make sure that you drink plenty of fluid before and after exercising and that you do not over do it and do not overheat.  We will continue with your topiramate at the current dose. I changed your prescription to 50 mg, take 1 pill twice daily, you can start with the next refill.   Follow up in 6 months, you can see one of our nurse practitioners at the time.

## 2016-06-14 ENCOUNTER — Ambulatory Visit: Payer: BC Managed Care – PPO | Admitting: Adult Health

## 2017-02-16 ENCOUNTER — Ambulatory Visit (INDEPENDENT_AMBULATORY_CARE_PROVIDER_SITE_OTHER): Payer: BC Managed Care – PPO | Admitting: Gynecology

## 2017-02-16 ENCOUNTER — Encounter: Payer: Self-pay | Admitting: Gynecology

## 2017-02-16 VITALS — BP 130/80

## 2017-02-16 DIAGNOSIS — B373 Candidiasis of vulva and vagina: Secondary | ICD-10-CM | POA: Diagnosis not present

## 2017-02-16 DIAGNOSIS — N760 Acute vaginitis: Secondary | ICD-10-CM

## 2017-02-16 DIAGNOSIS — B3731 Acute candidiasis of vulva and vagina: Secondary | ICD-10-CM

## 2017-02-16 DIAGNOSIS — R300 Dysuria: Secondary | ICD-10-CM

## 2017-02-16 DIAGNOSIS — B9689 Other specified bacterial agents as the cause of diseases classified elsewhere: Secondary | ICD-10-CM | POA: Diagnosis not present

## 2017-02-16 DIAGNOSIS — N941 Unspecified dyspareunia: Secondary | ICD-10-CM | POA: Diagnosis not present

## 2017-02-16 LAB — WET PREP FOR TRICH, YEAST, CLUE

## 2017-02-16 MED ORDER — FLUCONAZOLE 150 MG PO TABS: 150.0000 mg | ORAL_TABLET | Freq: Every day | ORAL | 0 refills | Status: DC

## 2017-02-16 MED ORDER — METRONIDAZOLE 500 MG PO TABS: 500.0000 mg | ORAL_TABLET | Freq: Two times a day (BID) | ORAL | 0 refills | Status: DC

## 2017-02-16 NOTE — Progress Notes (Signed)
    Adriana Brown Jan 05, 1971 [id number]        46 y.o.  [id number] presents complaining of vaginal irritation around the opening to the vagina.  Notes some stinging with urination on the outside.  No pelvic discomfort, urgency, frequency, low back pain, fever or chills.  No vaginal discharge or odor.  Also notes some discomfort with intercourse as far as vaginal dryness.  Not having issues with daily dryness but only with intercourse.  Past medical history,surgical history, problem list, medications, allergies, family history and social history were all reviewed and documented in the EPIC chart.  Directed ROS with pertinent positives and negatives documented in the history of present illness/assessment and plan.  Exam: Adriana Brown assistant Vitals:   02/16/17 1137  BP: 130/80   General appearance:  Normal Spine straight without CVA tenderness Abdomen soft nontender without masses guarding rebound Pelvic external BUS vagina with thick white discharge.  Cervix normal with IUD string palpated at the external loss.  Uterus normal size midline mobile nontender.  Adnexa without masses or tenderness.  Assessment/Plan:  46 y.o. Adriana Brown with:  1. Stinging type dysuria with urination.  Urine analysis is negative.  I suspect it is due to her vaginitis as discussed in #2 2. Vaginitis.  Wet prep positive for yeast and bacterial vaginosis.  Options for treatment reviewed.  Patient elects for Flagyl 500 mg twice daily times 7 days, alcohol avoidance reviewed.  Diflucan 150 mg daily times 3 days prescribed.  She is having some cutaneous type complaints of itching and I extended her course to cover this.  She will follow-up if the symptoms persist, worsen or recur. 3. Dyspareunia.  Dryness with intercourse.  Availability of OTC lubricants used for trial.  Follow-up if continues to be an issue despite this. Annual exam overdue and patient has appointment for this.     Adriana Brown, 12:01 PM  02/16/2017

## 2017-02-16 NOTE — Addendum Note (Signed)
Addended by: Nelva Nay on: 02/16/2017 12:12 PM   Modules accepted: Orders

## 2017-02-16 NOTE — Patient Instructions (Signed)
Take the Diflucan pill daily for 3 days to treat the yeast infection Take the Flagyl medication twice daily for 7 days to treat the bacterial infection.  Avoid alcohol while taking.

## 2017-02-17 LAB — URINALYSIS W MICROSCOPIC + REFLEX CULTURE
BILIRUBIN URINE: NEGATIVE
Bacteria, UA: NONE SEEN /HPF
GLUCOSE, UA: NEGATIVE
HGB URINE DIPSTICK: NEGATIVE
Hyaline Cast: NONE SEEN /LPF
KETONES UR: NEGATIVE
Leukocyte Esterase: NEGATIVE
Nitrites, Initial: NEGATIVE
PROTEIN: NEGATIVE
RBC / HPF: NONE SEEN /HPF (ref 0–2)
Specific Gravity, Urine: 1.02 (ref 1.001–1.03)
WBC UA: NONE SEEN /HPF (ref 0–5)
pH: 7 (ref 5.0–8.0)

## 2017-02-17 LAB — NO CULTURE INDICATED

## 2017-03-30 ENCOUNTER — Encounter: Payer: Self-pay | Admitting: Gynecology

## 2017-03-30 ENCOUNTER — Ambulatory Visit: Payer: BC Managed Care – PPO | Admitting: Gynecology

## 2017-03-30 VITALS — BP 124/82 | Ht 62.0 in | Wt 193.0 lb

## 2017-03-30 DIAGNOSIS — Z01419 Encounter for gynecological examination (general) (routine) without abnormal findings: Secondary | ICD-10-CM | POA: Diagnosis not present

## 2017-03-30 DIAGNOSIS — Z30431 Encounter for routine checking of intrauterine contraceptive device: Secondary | ICD-10-CM | POA: Diagnosis not present

## 2017-03-30 NOTE — Patient Instructions (Addendum)
Check with the gastroenterologist to see when they recommend you have your first screening colonoscopy.  Follow-up in 1 year, sooner as needed.

## 2017-03-30 NOTE — Progress Notes (Signed)
    CASSUNDRA MCKEEVER 10-Dec-1970 [id number]        46 y.o.  [id number] for annual gynecologic exam.  Doing well without gynecologic complaints  Past medical history,surgical history, problem list, medications, allergies, family history and social history were all reviewed and documented as reviewed in the EPIC chart.  ROS:  Performed with pertinent positives and negatives included in the history, assessment and plan.   Additional significant findings : None   Exam: [name] assistant Vitals:   03/30/17 1616  BP: 124/82  Weight: 193 lb (87.5 kg)  Height: 5\' 2"  (1.575 m)   Body mass index is 35.3 kg/m.  General appearance:  Normal affect, orientation and appearance. Skin: Grossly normal HEENT: Without gross lesions.  No cervical or supraclavicular adenopathy. Thyroid normal.  Lungs:  Clear without wheezing, rales or rhonchi Cardiac: RR, without RMG Abdominal:  Soft, nontender, without masses, guarding, rebound, organomegaly or hernia Breasts:  Examined lying and sitting without masses, retractions, discharge or axillary adenopathy. Pelvic:  Ext, BUS, Vagina: Normal  Cervix: Normal.  IUD string palpated at external loss  Uterus: Anteverted, normal size, shape and contour, midline and mobile nontender   Adnexa: Without masses or tenderness    Anus and perineum: Normal   Rectovaginal: Normal sphincter tone without palpated masses or tenderness.    Assessment/Plan:  46 y.o. G96P2002 female for annual gynecologic exam without menses, Mirena IUD.   1. Mirena IUD 10/2015.  Doing well without menses.  IUD string palpated at external loss. 2. Mammography 07/2016.  Continue with annual mammography when due.  Breast exam normal today.  SBE monthly reviewed. 3. Pap smear/HPV 12/2013.  No Pap smear done today.  No history of significant abnormal Pap smears.  Plan repeat Pap smear at 5-year interval per current screening guidelines. 4. Health maintenance.  No routine lab work done as patient  does this elsewhere.  Follow-up 1 year, sooner as needed.   Anastasio Auerbach MD, 4:39 PM 03/30/2017

## 2017-05-17 ENCOUNTER — Ambulatory Visit: Payer: BC Managed Care – PPO | Admitting: Gynecology

## 2017-05-17 ENCOUNTER — Encounter: Payer: Self-pay | Admitting: Gynecology

## 2017-05-17 VITALS — BP 120/78

## 2017-05-17 DIAGNOSIS — N898 Other specified noninflammatory disorders of vagina: Secondary | ICD-10-CM | POA: Diagnosis not present

## 2017-05-17 LAB — WET PREP FOR TRICH, YEAST, CLUE

## 2017-05-17 MED ORDER — FLUCONAZOLE 150 MG PO TABS: 150.0000 mg | ORAL_TABLET | Freq: Once | ORAL | 0 refills | Status: AC

## 2017-05-17 NOTE — Progress Notes (Signed)
    Adriana Brown 1971/03/24 [id number]        47 y.o.  [id number] resents with several days of vaginal irritation.  No discharge or odor.  No urinary symptoms such as frequency dysuria urgency low back pain fever or chills.  No nausea vomiting diarrhea constipation.  Past medical history,surgical history, problem list, medications, allergies, family history and social history were all reviewed and documented in the EPIC chart.  Directed ROS with pertinent positives and negatives documented in the history of present illness/assessment and plan.  Exam: [name] assistant Vitals:   05/17/17 1454  BP: 120/78   General appearance:  Normal Abdomen soft nontender without masses guarding rebound Pelvic external BUS vagina with clumpy white discharge.  Cervix normal.  IUD string palpable at the external loss.  Uterus grossly normal size midline mobile nontender.  Adnexa without masses or tenderness.  Assessment/Plan:  47 y.o. G2P2002 above history and exam.  Wet prep is positive for yeast.  We will treat with Diflucan 150 mg x1 dose.  Follow-up if symptoms persist, worsen or recur.    Anastasio Auerbach MD, 3:16 PM 05/17/2017

## 2017-05-17 NOTE — Patient Instructions (Signed)
Take the one Diflucan pill.  Follow-up if your symptoms persist, worsen or recur. 

## 2017-10-08 ENCOUNTER — Telehealth: Payer: Self-pay | Admitting: *Deleted

## 2017-10-08 NOTE — Telephone Encounter (Signed)
Patient called left message in triage voicemail "funny feeling" in stomach, no pelvic pain or pressure, has IUD, negative UPT. Pt called and left message who should she see for this PCP vs GYN. I called and got pt voicemail and told her to schedule PCP since no GYN pain.

## 2018-04-02 ENCOUNTER — Encounter: Payer: Self-pay | Admitting: Gynecology

## 2018-04-02 ENCOUNTER — Ambulatory Visit: Payer: BC Managed Care – PPO | Admitting: Gynecology

## 2018-04-02 VITALS — BP 120/82 | Ht 62.0 in | Wt 200.0 lb

## 2018-04-02 DIAGNOSIS — Z30431 Encounter for routine checking of intrauterine contraceptive device: Secondary | ICD-10-CM | POA: Diagnosis not present

## 2018-04-02 DIAGNOSIS — Z01419 Encounter for gynecological examination (general) (routine) without abnormal findings: Secondary | ICD-10-CM

## 2018-04-02 NOTE — Patient Instructions (Signed)
Follow-up in 1 year for annual exam 

## 2018-04-02 NOTE — Addendum Note (Signed)
Addended by: Lorine Bears on: 04/02/2018 04:42 PM   Modules accepted: Orders

## 2018-04-02 NOTE — Progress Notes (Signed)
    Adriana Brown 03-11-1971 [id number]        47 y.o.  [id number] for annual gynecologic exam.  Without gynecologic complaints  Past medical history,surgical history, problem list, medications, allergies, family history and social history were all reviewed and documented as reviewed in the EPIC chart.  ROS:  Performed with pertinent positives and negatives included in the history, assessment and plan.   Additional significant findings : None   Exam: [name] assistant Vitals:   04/02/18 1616  BP: 120/82  Weight: 200 lb (90.7 kg)  Height: 5\' 2"  (1.575 m)   Body mass index is 36.58 kg/m.  General appearance:  Normal affect, orientation and appearance. Skin: Grossly normal HEENT: Without gross lesions.  No cervical or supraclavicular adenopathy. Thyroid normal.  Lungs:  Clear without wheezing, rales or rhonchi Cardiac: RR, without RMG Abdominal:  Soft, nontender, without masses, guarding, rebound, organomegaly or hernia Breasts:  Examined lying and sitting without masses, retractions, discharge or axillary adenopathy. Pelvic:  Ext, BUS, Vagina: Normal  Cervix: Normal.  IUD string visualized at external loss.  Pap smear/HPV  Uterus: Anteverted, normal size, shape and contour, midline and mobile nontender   Adnexa: Without masses or tenderness    Anus and perineum: Normal   Rectovaginal: Normal sphincter tone without palpated masses or tenderness.    Assessment/Plan:  47 y.o. G82P2002 female for annual gynecologic exam without menses, Mirena IUD.   1. Mirena IUD 10/2015.  Doing well without menses. 2. Pap smear/HPV 12/2013.  Pap smear/HPV today.  No history of abnormal Pap smears previously. 3. Mammography 2019.  Continue with annual mammography next year when due.  Breast exam normal today. 4. Health maintenance.  No routine lab work done as patient does this elsewhere.  Follow-up 1 year, sooner as needed.   Anastasio Auerbach MD, 4:33 PM 04/02/2018

## 2018-04-04 LAB — PAP, TP IMAGING W/ HPV RNA, RFLX HPV TYPE 16,18/45: HPV DNA High Risk: NOT DETECTED

## 2018-04-09 ENCOUNTER — Other Ambulatory Visit: Payer: Self-pay | Admitting: Gynecology

## 2018-04-09 MED ORDER — FLUCONAZOLE 150 MG PO TABS: 150.0000 mg | ORAL_TABLET | Freq: Once | ORAL | 0 refills | Status: AC

## 2018-04-09 MED ORDER — FLUCONAZOLE 150 MG PO TABS: 150.0000 mg | ORAL_TABLET | Freq: Once | ORAL | 0 refills | Status: DC

## 2018-08-19 ENCOUNTER — Telehealth: Payer: Self-pay

## 2018-08-19 NOTE — Telephone Encounter (Addendum)
Patient called complaining of vaginal itching. No odor. No burning.  She asked if anyway you could sent Rx for her so she did not have to come out for a visit.

## 2018-08-20 NOTE — Telephone Encounter (Signed)
Telemedicine

## 2018-08-21 NOTE — Telephone Encounter (Signed)
Patient called back and said symptoms have resolved. She appreciated the offer of tele-visit and said if sx recur she will call for that.

## 2018-08-21 NOTE — Telephone Encounter (Signed)
Left message for patient to call me

## 2018-12-02 ENCOUNTER — Ambulatory Visit
Admission: RE | Admit: 2018-12-02 | Discharge: 2018-12-02 | Disposition: A | Discharge status: Home / Self Care | Payer: BC Managed Care – PPO | Source: Ambulatory Visit | Attending: Family Medicine | Admitting: Family Medicine

## 2018-12-02 ENCOUNTER — Other Ambulatory Visit: Payer: Self-pay | Admitting: Family Medicine

## 2018-12-02 ENCOUNTER — Other Ambulatory Visit: Payer: Self-pay

## 2018-12-02 DIAGNOSIS — M5442 Lumbago with sciatica, left side: Secondary | ICD-10-CM

## 2019-01-14 ENCOUNTER — Encounter: Payer: Self-pay | Admitting: Gynecology

## 2019-04-03 ENCOUNTER — Other Ambulatory Visit: Payer: Self-pay

## 2019-04-04 ENCOUNTER — Encounter: Payer: Self-pay | Admitting: Gynecology

## 2019-04-04 ENCOUNTER — Ambulatory Visit: Payer: BC Managed Care – PPO | Admitting: Gynecology

## 2019-04-04 VITALS — BP 118/76 | Ht 62.0 in | Wt 198.0 lb

## 2019-04-04 DIAGNOSIS — Z01419 Encounter for gynecological examination (general) (routine) without abnormal findings: Secondary | ICD-10-CM | POA: Diagnosis not present

## 2019-04-04 DIAGNOSIS — Z30431 Encounter for routine checking of intrauterine contraceptive device: Secondary | ICD-10-CM

## 2019-04-04 NOTE — Progress Notes (Signed)
    Adriana Brown November 05, 1970 [id number]        48 y.o.  [id number] for annual gynecologic exam.  Without gynecologic complaints  Past medical history,surgical history, problem list, medications, allergies, family history and social history were all reviewed and documented as reviewed in the EPIC chart.  ROS:  Performed with pertinent positives and negatives included in the history, assessment and plan.   Additional significant findings : None   Exam: [name] assistant Vitals:   04/04/19 1520  BP: 118/76  Weight: 198 lb (89.8 kg)  Height: 5\' 2"  (1.575 m)   Body mass index is 36.21 kg/m.  General appearance:  Normal affect, orientation and appearance. Skin: Grossly normal HEENT: Without gross lesions.  No cervical or supraclavicular adenopathy. Thyroid normal.  Lungs:  Clear without wheezing, rales or rhonchi Cardiac: RR, without RMG Abdominal:  Soft, nontender, without masses, guarding, rebound, organomegaly or hernia Breasts:  Examined lying and sitting without masses, retractions, discharge or axillary adenopathy. Pelvic:  Ext, BUS, Vagina: Normal  Cervix: Normal.  IUD string visualized  Uterus: Grossly normal size midline mobile nontender  Adnexa: Without masses or tenderness    Anus and perineum: Normal   Rectovaginal: Normal sphincter tone without palpated masses or tenderness.    Assessment/Plan:  48 y.o. G31P2002 female for annual gynecologic exam.  Without menses, Mirena IUD  1. Mirena IUD 10/2015.  Doing well without menses.  IUD string visualized. 2. Pap smear/HPV 2019.  No Pap smear done today.  No history of abnormal Pap smears.  Plan repeat Pap smear/HPV at 5-year interval per current screening guidelines. 3. Mammography 09/2018.  Continue with annual mammography when due.  Breast exam normal today. 4. Health maintenance.  No routine lab work done as patient does this elsewhere.  Follow-up 1 year, sooner as needed.   Anastasio Auerbach MD, 3:48 PM  04/04/2019

## 2019-04-04 NOTE — Patient Instructions (Signed)
Follow-up in 1 year for annual exam, sooner as needed. 

## 2019-12-07 IMAGING — CR LUMBAR SPINE - COMPLETE 4+ VIEW
5 series · 5 of 5 positions shown · non-contrast
Comparison: Radiographs of the lumbar spine 07/12/2012

CLINICAL DATA: Acute back pain with sciatica, left

EXAM:
LUMBAR SPINE - COMPLETE 4+ VIEW

[t lumbar spine ap]
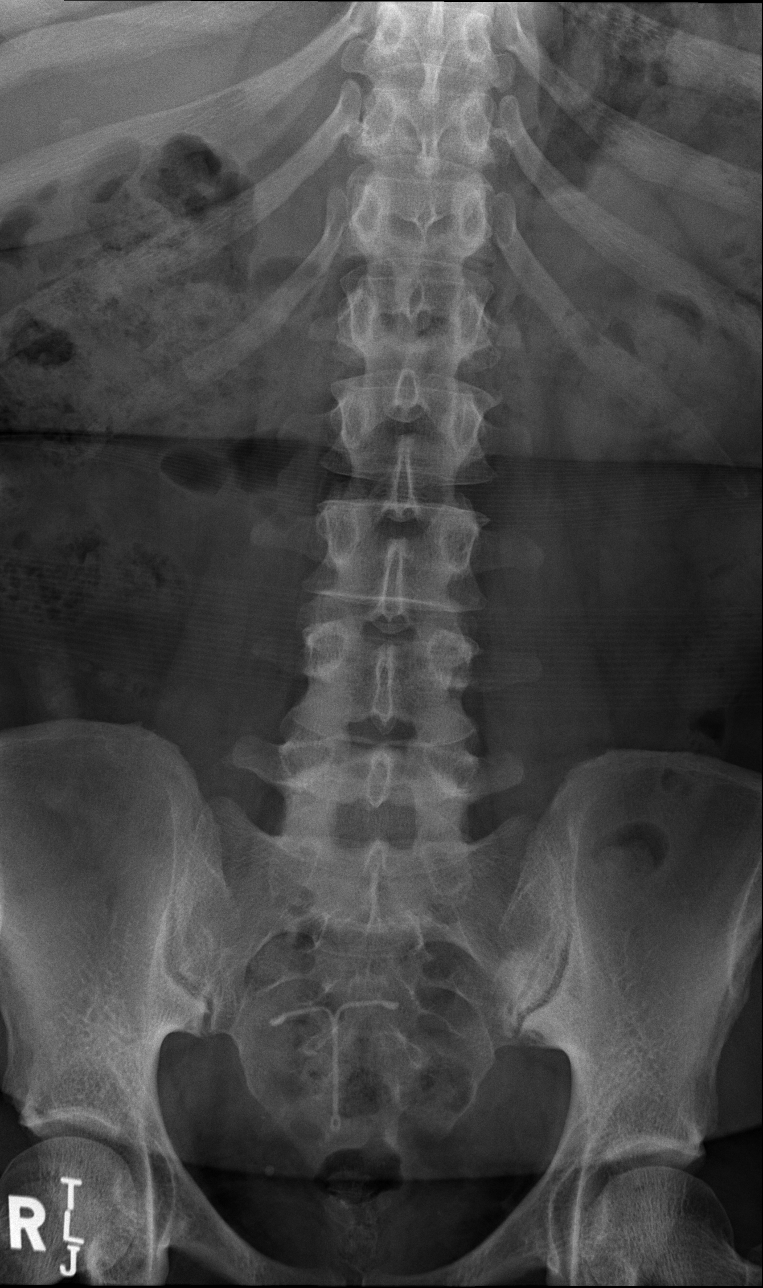

[t lumbar spine obl (1 of 2)]
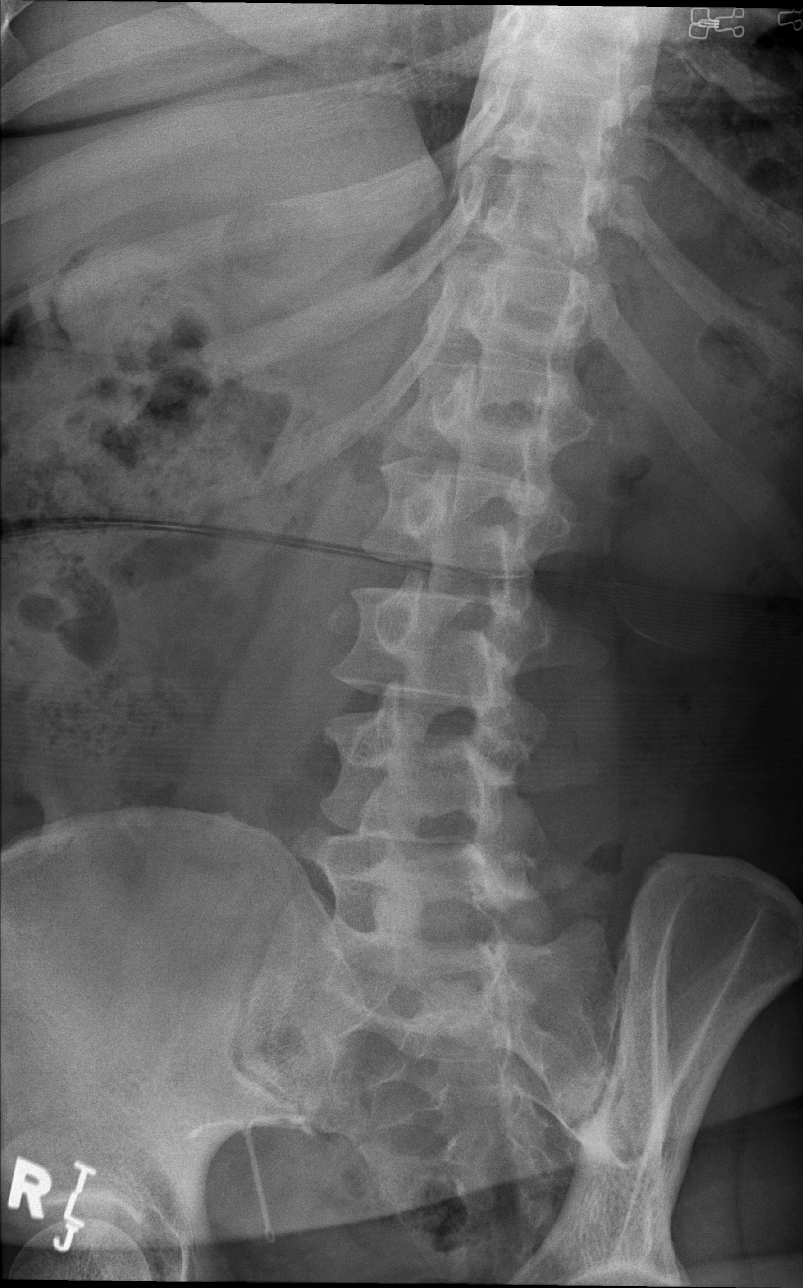

[t lumbar spine obl (2 of 2)]
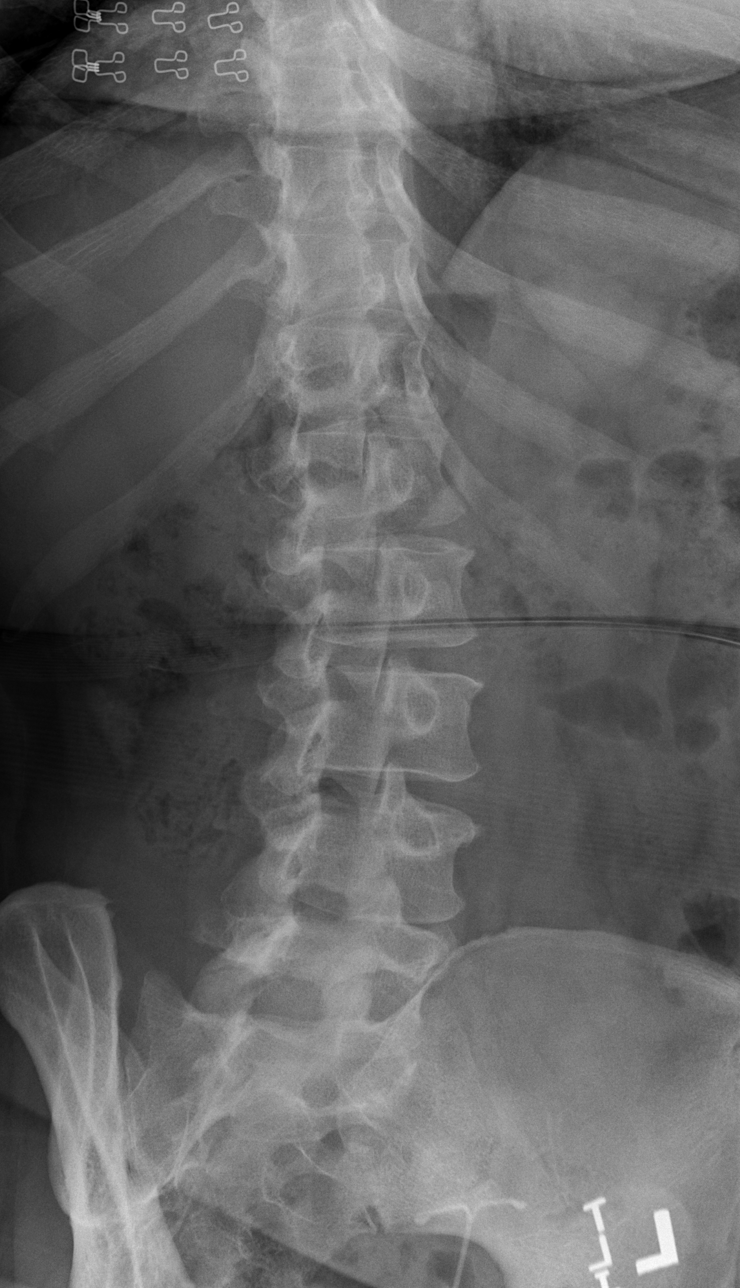

[t lumbar spine lat]
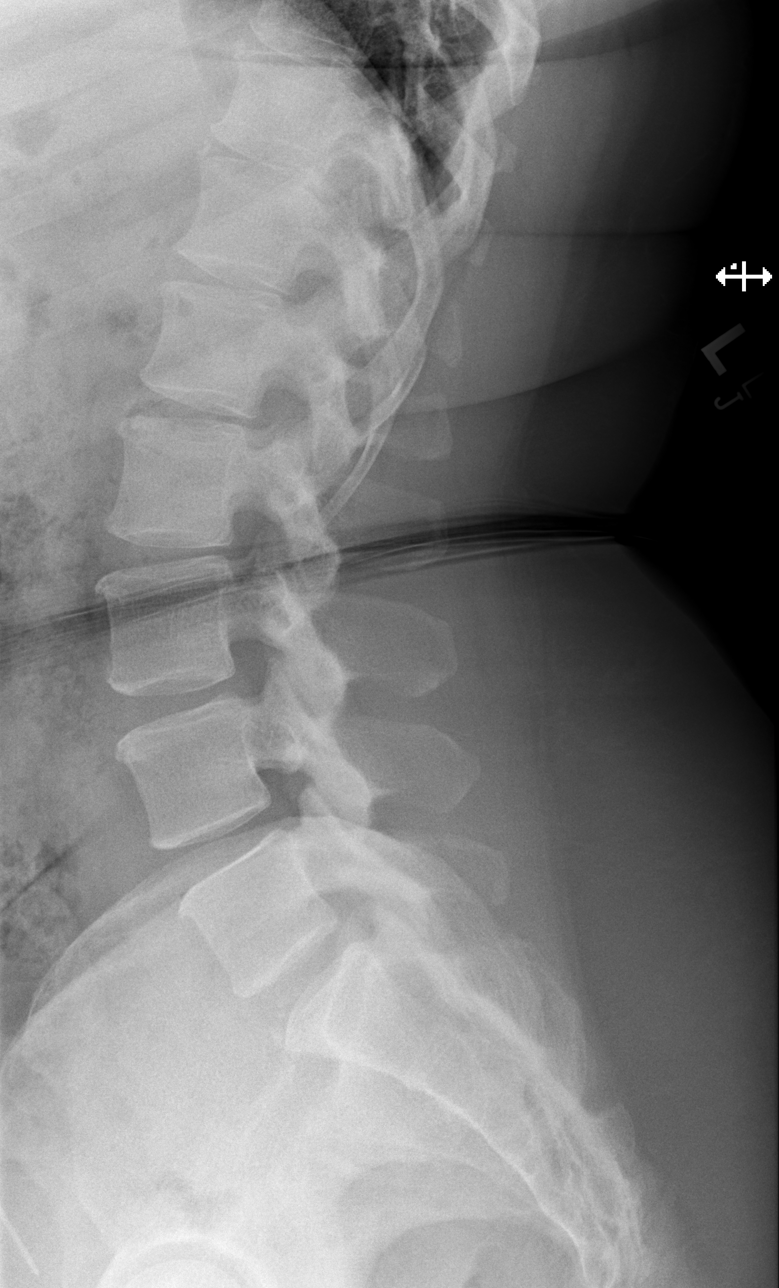

[t lumbar l-5 s-1 spot]
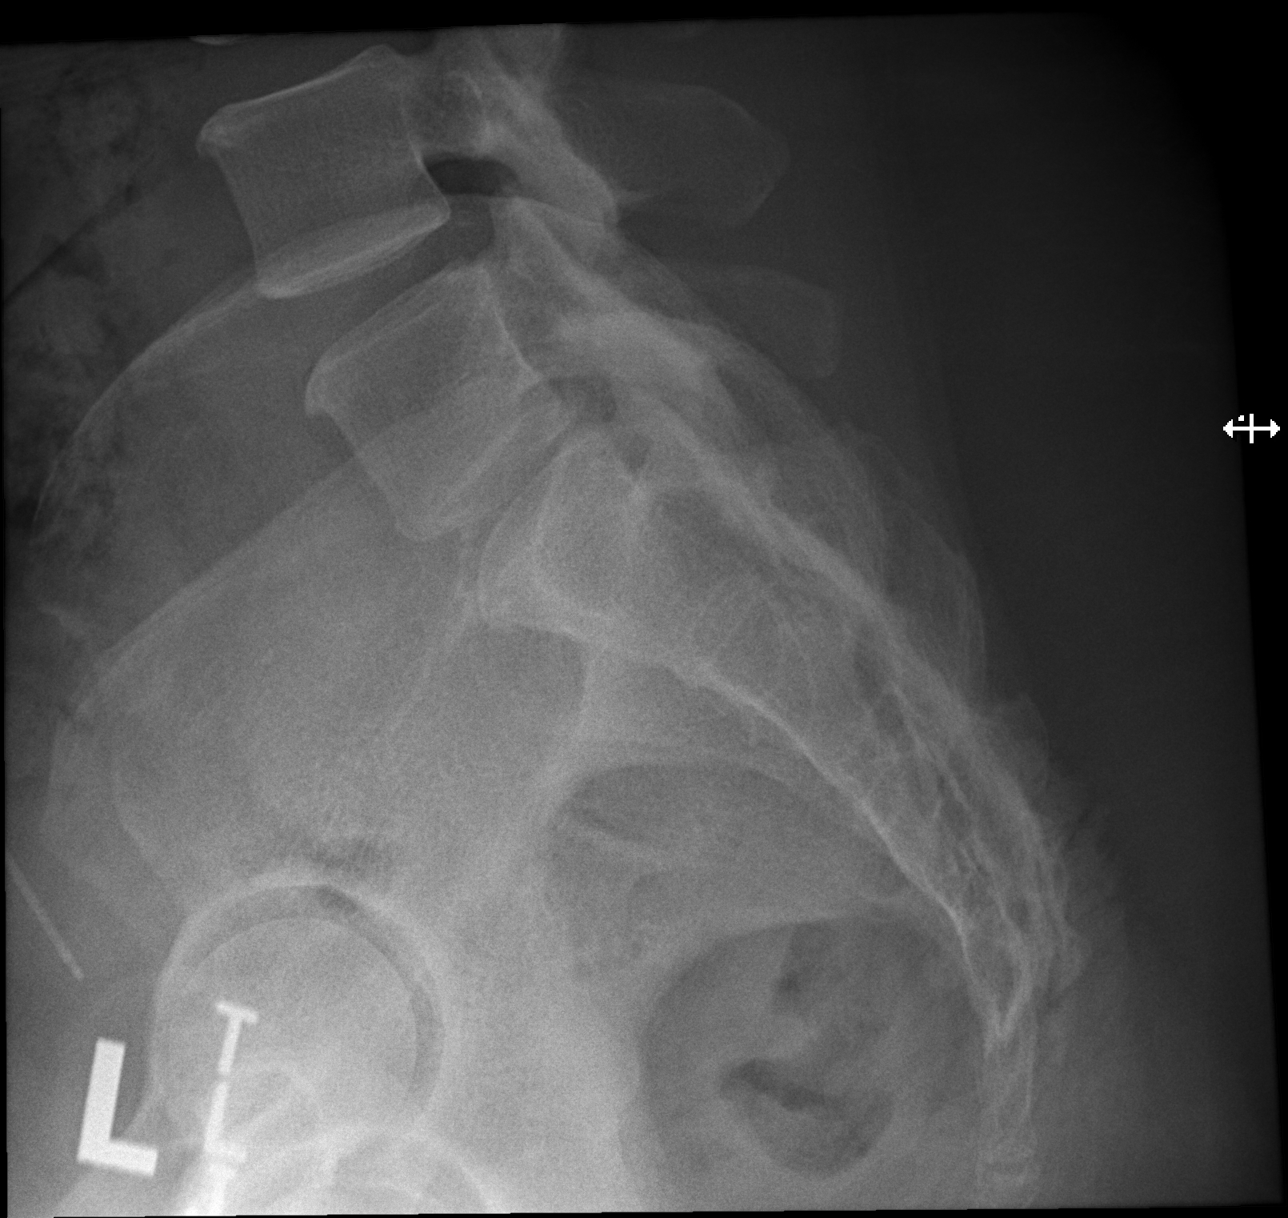

[5 of 5 positions shown; findings below may reference images not displayed]

FINDINGS: There are 5 non-rib-bearing lumbar type vertebral bodies. The
vertebrae are normally aligned. Vertebral body height is maintained.
Question mild L1-L2 and L2-L3 disc height loss. Subtle multilevel
endplate spurring. Mild L4-L5 and L5-S1 facet arthrosis. No
convincing pars interarticularis defect on oblique views. The
sacroiliac joints are symmetric and unremarkable. An IUD projects
over the midline sacrum.
IMPRESSION: Mild lumbar degenerative change as described.

## 2020-04-07 ENCOUNTER — Other Ambulatory Visit: Payer: Self-pay

## 2020-04-07 ENCOUNTER — Ambulatory Visit: Payer: BC Managed Care – PPO | Admitting: Obstetrics and Gynecology

## 2020-04-07 ENCOUNTER — Encounter: Payer: Self-pay | Admitting: Obstetrics and Gynecology

## 2020-04-07 VITALS — BP 124/80 | Ht 62.0 in | Wt 204.0 lb

## 2020-04-07 DIAGNOSIS — Z30431 Encounter for routine checking of intrauterine contraceptive device: Secondary | ICD-10-CM | POA: Diagnosis not present

## 2020-04-07 DIAGNOSIS — Z01419 Encounter for gynecological examination (general) (routine) without abnormal findings: Secondary | ICD-10-CM

## 2020-04-07 NOTE — Progress Notes (Signed)
   JERRIYAH LOUIS 01/16/1971 [id number]  SUBJECTIVE:  49 y.o. [id number] female for annual routine gynecologic exam. She has no gynecologic concerns.  Current Outpatient Medications  Medication Sig Dispense Refill  . losartan (COZAAR) 50 MG tablet Take 50 mg by mouth daily.    . Multiple Vitamin (MULTIVITAMIN) capsule Take 1 capsule by mouth daily.     Current Facility-Administered Medications  Medication Dose Route Frequency Provider Last Rate Last Admin  . levonorgestrel (MIRENA) 20 MCG/24HR IUD   Intrauterine Once Fontaine, Belinda Block, MD       Allergies: Patient has no known allergies.  No LMP recorded. (Menstrual status: IUD).  Past medical history,surgical history, problem list, medications, allergies, family history and social history were all reviewed and documented as reviewed in the EPIC chart.  ROS:  Feeling well. No dyspnea or chest pain on exertion.  No abdominal pain, change in bowel habits, black or bloody stools.  No urinary tract symptoms. GYN ROS: normal menses, no abnormal bleeding, pelvic pain or discharge, no breast pain or new or enlarging lumps on self exam. No neurological complaints.   OBJECTIVE:  BP 124/80   Ht 5\' 2"  (1.575 m)   Wt 204 lb (92.5 kg)   BMI 37.31 kg/m  The patient appears well, alert, oriented, in no distress. ENT normal.  Neck supple. No cervical or supraclavicular adenopathy or thyromegaly.  Lungs are clear, good air entry, no wheezes, rhonchi or rales. S1 and S2 normal, no murmurs, regular rate and rhythm.  Abdomen soft without tenderness, guarding, mass or organomegaly.  Neurological is normal, no focal findings.  BREAST EXAM: breasts appear normal, no suspicious masses, no skin or nipple changes or axillary nodes  PELVIC EXAM: VULVA: normal appearing vulva with no masses, tenderness or lesions, VAGINA: normal appearing vagina with normal color and discharge, no lesions, CERVIX: normal appearing cervix without discharge or lesions, IUD  string visualized, UTERUS: uterus is normal size, shape, consistency and nontender, ADNEXA: normal adnexa in size, nontender and no masses  Chaperone: [name] present during the examination  ASSESSMENT:  49 y.o. [id number] here for annual gynecologic exam  PLAN:   1. No hormonal or menstrual concerns.  2. Pap smear/HPV 2019.  No history history of abnormal Pap smears.  Next Pap smear due 2024 following the current guidelines recommending the 5 year interval. 3. Contraception.  Mirena IUD 10/2015.  The string is visualized.  Discussed replacement 5 years in 10/2020 and she will plan to make appointment. 4. Mammogram 09/2019.  Breast exam normal.  Continue with with annual mammograms. 5. Health maintenance.  No labs today as she normally has these completed elsewhere.  Return annually or sooner, prn.  Joseph Pierini MD 04/07/20

## 2020-09-04 ENCOUNTER — Ambulatory Visit (HOSPITAL_COMMUNITY)
Admission: RE | Admit: 2020-09-04 | Discharge: 2020-09-04 | Disposition: A | Discharge status: Home / Self Care | Payer: No Typology Code available for payment source | Source: Ambulatory Visit | Attending: Family Medicine | Admitting: Family Medicine

## 2020-09-04 ENCOUNTER — Encounter (HOSPITAL_COMMUNITY): Payer: Self-pay

## 2020-09-04 ENCOUNTER — Other Ambulatory Visit: Payer: Self-pay

## 2020-09-04 VITALS — BP 138/91 | HR 103 | Temp 97.9°F | Resp 17

## 2020-09-04 DIAGNOSIS — M545 Low back pain, unspecified: Secondary | ICD-10-CM | POA: Diagnosis not present

## 2020-09-04 DIAGNOSIS — M25561 Pain in right knee: Secondary | ICD-10-CM | POA: Diagnosis not present

## 2020-09-04 MED ORDER — CYCLOBENZAPRINE HCL 10 MG PO TABS: 10.0000 mg | ORAL_TABLET | Freq: Every evening | ORAL | 0 refills | Status: DC | PRN

## 2020-09-04 NOTE — ED Triage Notes (Signed)
Pt presents with lower back pain, right wrist pain, right knee pain and right shin pain after a fall at work X 4 days ago.

## 2020-09-04 NOTE — ED Provider Notes (Signed)
[place]    CSN: [id number] Arrival date & time: 09/04/20  1653      History   Chief Complaint Chief Complaint  Patient presents with  . APPOINTMENT: Fall Injury [facility])    HPI Adriana Brown is a 50 y.o. female.   Patient presenting today with 5-day history of right lower leg, knee, low back pain after falling onto the side onto the floor.  She states she progressively became more sore throughout that day and has stayed sore since.  Able to move all areas and has been going about her daily activities but concerned that she is still the sore.  Denies bruising, swelling, redness, numbness, tingling, bowel or bladder incontinence, fevers.  Has been taking some pain relievers here and there and using a topical pain relieving gel solution at bedtime.  No past history of known orthopedic issues.     Past Medical History:  Diagnosis Date  . HOH (hard of hearing)   . Hypertension   . Pre-eclampsia   . Vitamin D deficiency 2013   26 /23.5  . Vitamin D deficiency     Patient Active Problem List   Diagnosis Date Noted  . BMI 34.0-34.9,adult 12/11/2011  . Blood pressure elevated without history of HTN 12/11/2011  . Hearing loss of both ears 06/25/2011    Past Surgical History:  Procedure Laterality Date  . CESAREAN SECTION  2005  . CESAREAN SECTION  2007  . Mirena     replaced 10/25/2015    OB History    Gravida  2   Para  2   Term  2   Preterm      AB  0   Living  2     SAB      IAB      Ectopic  0   Multiple      Live Births               Home Medications    Prior to Admission medications   Medication Sig Start Date End Date Taking? Authorizing Provider  cyclobenzaprine (FLEXERIL) 10 MG tablet Take 1 tablet (10 mg total) by mouth at bedtime as needed for muscle spasms. 09/04/20  Yes Volney American, PA-C  losartan (COZAAR) 50 MG tablet Take 50 mg by mouth daily.    [provider]  Multiple Vitamin (MULTIVITAMIN)  capsule Take 1 capsule by mouth daily.    [provider]  levonorgestrel (MIRENA) 20 MCG/24HR IUD 1 each by Intrauterine route once. Inserted 11/07/05   11/17/10  [provider]    Family History Family History  Problem Relation Age of Onset  . Hypertension Mother   . Hypertension Father   . Hypertension Sister   . Diabetes Sister   . Hypertension Brother   . Diabetes Maternal Aunt     Social History Social History   Tobacco Use  . Smoking status: Never Smoker  . Smokeless tobacco: Never Used  Vaping Use  . Vaping Use: Never used  Substance Use Topics  . Alcohol use: No    Alcohol/week: 0.0 standard drinks  . Drug use: No     Allergies   Patient has no known allergies.   Review of Systems Review of Systems Per HPI  Physical Exam Triage Vital Signs ED Triage Vitals  Enc Vitals Group     BP 09/04/20 1706 (!) 138/91     Pulse Rate 09/04/20 1706 (!) 103     Resp 09/04/20  1706 17     Temp 09/04/20 1706 97.9 F (36.6 C)     Temp Source 09/04/20 1706 Oral     SpO2 09/04/20 1706 97 %     Weight --      Height --      Head Circumference --      Peak Flow --      Pain Score 09/04/20 1709 6     Pain Loc --      Pain Edu? --      Excl. in [place]? --    No data found.  Updated Vital Signs BP (!) 138/91 (BP Location: Right Arm)   Pulse (!) 103   Temp 97.9 F (36.6 C) (Oral)   Resp 17   SpO2 97%   Visual Acuity Right Eye Distance:   Left Eye Distance:   Bilateral Distance:    Right Eye Near:   Left Eye Near:    Bilateral Near:     Physical Exam Vitals and nursing note reviewed.  Constitutional:      Appearance: Normal appearance. She is not ill-appearing.  HENT:     Head: Atraumatic.  Eyes:     Extraocular Movements: Extraocular movements intact.     Conjunctiva/sclera: Conjunctivae normal.  Cardiovascular:     Rate and Rhythm: Normal rate and regular rhythm.     Heart sounds: Normal heart sounds.  Pulmonary:     Effort:  Pulmonary effort is normal.     Breath sounds: Normal breath sounds.  Musculoskeletal:        General: Signs of injury present. No swelling, tenderness or deformity. Normal range of motion.     Cervical back: Normal range of motion and neck supple.     Comments: Minimal tenderness to palpation in all areas noted to have pain, no joint laxity right knee, negative McMurray's testing and drawer testing.  No midline spinal tenderness palpation diffusely.  Excellent range of motion diffusely, normal gait observed  Skin:    General: Skin is warm and dry.     Findings: No bruising or erythema.  Neurological:     Mental Status: She is alert and oriented to person, place, and time.     Motor: No weakness.     Gait: Gait normal.     Comments: All 4 extremities neurovascularly intact  Psychiatric:        Mood and Affect: Mood normal.        Thought Content: Thought content normal.        Judgment: Judgment normal.    UC Treatments / Results  Labs (all labs ordered are listed, but only abnormal results are displayed) Labs Reviewed - No data to display  EKG   Radiology No results found.  Procedures Procedures (including critical care time)  Medications Ordered in UC Medications - No data to display  Initial Impression / Assessment and Plan / UC Course  I have reviewed the triage vital signs and the nursing notes.  Pertinent labs & imaging results that were available during my care of the patient were reviewed by me and considered in my medical decision making (see chart for details).     No apparent bony injury, patient ambulating well without concerning exam findings.  Will defer x-ray imaging at this time given these findings and treat with Flexeril, continued ibuprofen, muscle rubs, rest.  Follow-up with sports medicine if symptoms worsening or not fully resolving.  Final Clinical Impressions(s) / UC Diagnoses   Final diagnoses:  Acute pain of right knee  Acute right-sided low  back pain without sciatica   Discharge Instructions   None    ED Prescriptions    Medication Sig Dispense Auth. Provider   cyclobenzaprine (FLEXERIL) 10 MG tablet Take 1 tablet (10 mg total) by mouth at bedtime as needed for muscle spasms. 10 tablet Volney American, [place]     PDMP not reviewed this encounter.   Volney American, [place] 09/04/20 1737

## 2020-10-01 ENCOUNTER — Other Ambulatory Visit: Payer: Self-pay | Admitting: Occupational Medicine

## 2020-10-01 ENCOUNTER — Ambulatory Visit: Payer: Self-pay

## 2020-10-01 ENCOUNTER — Other Ambulatory Visit: Payer: Self-pay

## 2020-10-01 DIAGNOSIS — M25531 Pain in right wrist: Secondary | ICD-10-CM

## 2020-10-01 DIAGNOSIS — M25561 Pain in right knee: Secondary | ICD-10-CM

## 2020-10-04 ENCOUNTER — Other Ambulatory Visit: Payer: Self-pay

## 2020-10-04 DIAGNOSIS — Z30430 Encounter for insertion of intrauterine contraceptive device: Secondary | ICD-10-CM

## 2020-10-21 ENCOUNTER — Encounter: Payer: Self-pay | Admitting: Internal Medicine

## 2020-10-26 ENCOUNTER — Ambulatory Visit (INDEPENDENT_AMBULATORY_CARE_PROVIDER_SITE_OTHER): Payer: BC Managed Care – PPO | Admitting: Obstetrics & Gynecology

## 2020-10-26 ENCOUNTER — Encounter: Payer: Self-pay | Admitting: Obstetrics & Gynecology

## 2020-10-26 ENCOUNTER — Other Ambulatory Visit: Payer: Self-pay

## 2020-10-26 VITALS — BP 110/76 | HR 99 | Resp 14

## 2020-10-26 DIAGNOSIS — Z30433 Encounter for removal and reinsertion of intrauterine contraceptive device: Secondary | ICD-10-CM

## 2020-10-26 DIAGNOSIS — Z30430 Encounter for insertion of intrauterine contraceptive device: Secondary | ICD-10-CM

## 2020-10-26 NOTE — Progress Notes (Signed)
    Adriana Brown 10-Mar-1971 [id number]        50 y.o.  G2P2002   RP: Mirena IUD removal/Reinsertion of a new Mirena IUD  HPI: Well on Mirena IUD x 5 years.  No vaginal bleeding, no pelvic pain.  Wants to have a new one inserted.   OB History  Gravida Para Term Preterm AB Living  2 2 2    0 2  SAB IAB Ectopic Multiple Live Births      0        # Outcome Date GA Lbr Len/2nd Weight Sex Delivery Anes PTL Lv  2 Term           1 Term             Past medical history,surgical history, problem list, medications, allergies, family history and social history were all reviewed and documented in the EPIC chart.   Directed ROS with pertinent positives and negatives documented in the history of present illness/assessment and plan.  Exam:  Vitals:   10/26/20 1102  BP: 110/76  Pulse: 99  Resp: 14   General appearance:  Normal                                                                    IUD procedure note       Patient presented to the office today for removal/placement of Mirena IUD. The patient had previously been provided with literature information on this method of contraception. The risks benefits and pros and cons were discussed and all her questions were answered. She is fully aware that this form of contraception is 99% effective and is good for 5-7 years.  Pelvic exam: Vulva normal Vagina: No lesions or discharge Cervix: No lesions or discharge.  IUD strings visible.  Strings grasped and pulled with an IUD removal clamp.  IUD complete/intact. Well tolerated without Cx. Uterus: AV position Adnexa: No masses or tenderness Rectal exam: Not done  The cervix was cleansed with Betadine solution. Hurricane spray on the cervix.  A single-tooth tenaculum was placed on the anterior cervical lip. Os finder to mildly dilate the cervix and find the endocervical curve.  The IUD was shown to the patient and inserted in a sterile fashion.  Hysterometry with the IUD as being inserted  was 7 cm.  The IUD string was trimmed. The single-tooth tenaculum was removed. Patient was instructed to return back to the office in one month for follow up.         Assessment/Plan:  50 y.o. G2P2002   1. Encounter for IUD removal and reinsertion Time to change her Mirena IUD after 5 years.  Procedures for removal/reinsertion of a new Mirena IUD reviewed.  Easy removal of Mirena IUD which was complete and intact.  Mirena IUD insertion without difficulty.  Insertion caused a strong uterine cramp which improved shortly after.  Post procedure precautions reviewed.  F/U in 4 weeks for IUD check.  2. Encounter for insertion of mirena IUD - IUD Insertion  Other orders - losartan (COZAAR) 100 MG tablet; Take 100 mg by mouth daily.   Princess Bruins MD, 11:29 AM 10/26/2020

## 2020-10-27 ENCOUNTER — Encounter: Payer: Self-pay | Admitting: Obstetrics & Gynecology

## 2020-11-02 ENCOUNTER — Ambulatory Visit: Payer: BC Managed Care – PPO | Admitting: Obstetrics and Gynecology

## 2020-11-02 ENCOUNTER — Ambulatory Visit: Payer: BC Managed Care – PPO | Admitting: Nurse Practitioner

## 2020-11-03 ENCOUNTER — Other Ambulatory Visit: Payer: Self-pay | Admitting: Family Medicine

## 2020-11-03 DIAGNOSIS — R220 Localized swelling, mass and lump, head: Secondary | ICD-10-CM

## 2020-11-10 ENCOUNTER — Ambulatory Visit
Admission: RE | Admit: 2020-11-10 | Discharge: 2020-11-10 | Disposition: A | Discharge status: Home / Self Care | Payer: BLUE CROSS/BLUE SHIELD | Source: Ambulatory Visit | Attending: Family Medicine | Admitting: Family Medicine

## 2020-11-10 ENCOUNTER — Other Ambulatory Visit: Payer: Self-pay

## 2020-11-10 DIAGNOSIS — R220 Localized swelling, mass and lump, head: Secondary | ICD-10-CM

## 2020-11-10 MED ORDER — IOPAMIDOL (ISOVUE-300) INJECTION 61%
75.0000 mL | Freq: Once | INTRAVENOUS | Status: AC | PRN
  Administered 2020-11-10: 75 mL via INTRAVENOUS

## 2020-11-17 ENCOUNTER — Other Ambulatory Visit: Payer: Self-pay

## 2020-11-17 ENCOUNTER — Ambulatory Visit (AMBULATORY_SURGERY_CENTER): Payer: BLUE CROSS/BLUE SHIELD | Admitting: *Deleted

## 2020-11-17 VITALS — Ht 62.0 in | Wt 201.0 lb

## 2020-11-17 DIAGNOSIS — Z1211 Encounter for screening for malignant neoplasm of colon: Secondary | ICD-10-CM

## 2020-11-17 MED ORDER — SUTAB 1479-225-188 MG PO TABS: 1.0000 | ORAL_TABLET | ORAL | 0 refills | Status: DC

## 2020-11-17 NOTE — Progress Notes (Signed)
Patient's pre-visit was done today over the phone with the patient due to COVID-19 pandemic. Name,DOB and address verified. Insurance verified. Patient denies any allergies to Eggs and Soy. Patient denies any problems with anesthesia/sedation. Patient denies taking diet pills or blood thinners. No home Oxygen. Packet of Prep instructions mailed to patient including a copy of a consent form-pt is aware. Patient understands to call us back with any questions or concerns. Patient is aware of our care-partner policy and Covid-19 safety protocol.   EMMI education assigned to the patient for the procedure, sent to MyChart.   The patient is COVID-19 vaccinated, per patient.  

## 2020-12-01 ENCOUNTER — Other Ambulatory Visit: Payer: Self-pay

## 2020-12-01 ENCOUNTER — Encounter: Payer: Self-pay | Admitting: Internal Medicine

## 2020-12-01 ENCOUNTER — Ambulatory Visit: Payer: BC Managed Care – PPO | Admitting: Obstetrics & Gynecology

## 2020-12-01 ENCOUNTER — Ambulatory Visit (AMBULATORY_SURGERY_CENTER): Payer: BC Managed Care – PPO | Admitting: Internal Medicine

## 2020-12-01 VITALS — BP 112/76 | HR 86 | Temp 97.3°F | Resp 15 | Ht 62.0 in | Wt 201.0 lb

## 2020-12-01 DIAGNOSIS — D123 Benign neoplasm of transverse colon: Secondary | ICD-10-CM

## 2020-12-01 DIAGNOSIS — Z1211 Encounter for screening for malignant neoplasm of colon: Secondary | ICD-10-CM

## 2020-12-01 MED ORDER — SODIUM CHLORIDE 0.9 % IV SOLN: 500.0000 mL | Freq: Once | INTRAVENOUS | Status: DC

## 2020-12-01 NOTE — Progress Notes (Signed)
Pt's states no medical or surgical changes since previsit or office visit.   VS taken by CW 

## 2020-12-01 NOTE — Progress Notes (Signed)
Called to room to assist during endoscopic procedure.  Patient ID and intended procedure confirmed with present staff. Received instructions for my participation in the procedure from the performing physician.  

## 2020-12-01 NOTE — Op Note (Signed)
[facility] Patient Name: Adriana Brown Procedure Date: 12/01/2020 9:38 AM MRN: [id number] Endoscopist: Jerene Bears , MD Age: 50 Referring MD:  Date of Birth: 01-05-1971 Gender: Female Account #: [id number] Procedure:                Colonoscopy Indications:              Screening for colorectal malignant neoplasm, This                            is the patient's first colonoscopy Medicines:                Monitored Anesthesia Care Procedure:                Pre-Anesthesia Assessment:                           - Prior to the procedure, a History and Physical                            was performed, and patient medications and                            allergies were reviewed. The patient's tolerance of                            previous anesthesia was also reviewed. The risks                            and benefits of the procedure and the sedation                            options and risks were discussed with the patient.                            All questions were answered, and informed consent                            was obtained. Prior Anticoagulants: The patient has                            taken no previous anticoagulant or antiplatelet                            agents. ASA Grade Assessment: II - A patient with                            mild systemic disease. After reviewing the risks                            and benefits, the patient was deemed in                            satisfactory condition to undergo the procedure.  After obtaining informed consent, the colonoscope                            was passed under direct vision. Throughout the                            procedure, the patient's blood pressure, pulse, and                            oxygen saturations were monitored continuously. The                            Olympus CF-HQ190L Colonoscope was introduced                            through the anus and advanced to  the cecum,                            identified by palpation. The colonoscopy was                            performed without difficulty. The patient tolerated                            the procedure well. The quality of the bowel                            preparation was good. The ileocecal valve,                            appendiceal orifice, and rectum were photographed. Scope In: 9:45:38 AM Scope Out: 9:57:56 AM Scope Withdrawal Time: 0 hours 10 minutes 12 seconds  Total Procedure Duration: 0 hours 12 minutes 18 seconds  Findings:                 The perianal and digital rectal examinations were                            normal.                           A 4 mm polyp was found in the proximal transverse                            colon. The polyp was sessile. The polyp was removed                            with a cold snare. Resection and retrieval were                            complete.                           The exam was otherwise without abnormality on  direct and retroflexion views. Complications:            No immediate complications. Estimated Blood Loss:     Estimated blood loss: none. Impression:               - One 4 mm polyp in the proximal transverse colon,                            removed with a cold snare. Resected and retrieved.                           - The examination was otherwise normal on direct                            and retroflexion views. Recommendation:           - Patient has a contact number available for                            emergencies. The signs and symptoms of potential                            delayed complications were discussed with the                            patient. Return to normal activities tomorrow.                            Written discharge instructions were provided to the                            patient.                           - Resume previous diet.                            - Continue present medications.                           - Await pathology results.                           - Repeat colonoscopy is recommended. The                            colonoscopy date will be determined after pathology                            results from today's exam become available for                            review. Jerene Bears, MD 12/01/2020 9:59:45 AM This report has been signed electronically.

## 2020-12-01 NOTE — Patient Instructions (Addendum)
YOU HAD AN ENDOSCOPIC PROCEDURE TODAY AT THE [facility]:   Refer to the procedure report that was given to you for any specific questions about what was found during the examination.  If the procedure report does not answer your questions, please call your gastroenterologist to clarify.  If you requested that your care partner not be given the details of your procedure findings, then the procedure report has been included in a sealed envelope for you to review at your convenience later.  YOU SHOULD EXPECT: Some feelings of bloating in the abdomen. Passage of more gas than usual.  Walking can help get rid of the air that was put into your GI tract during the procedure and reduce the bloating. If you had a lower endoscopy (such as a colonoscopy or flexible sigmoidoscopy) you may notice spotting of blood in your stool or on the toilet paper. If you underwent a bowel prep for your procedure, you may not have a normal bowel movement for a few days.  Please Note:  You might notice some irritation and congestion in your nose or some drainage.  This is from the oxygen used during your procedure.  There is no need for concern and it should clear up in a day or so.  SYMPTOMS TO REPORT IMMEDIATELY:  Following lower endoscopy (colonoscopy or flexible sigmoidoscopy):  Excessive amounts of blood in the stool  Significant tenderness or worsening of abdominal pains  Swelling of the abdomen that is new, acute  Fever of 100F or higher   For urgent or emergent issues, a gastroenterologist can be reached at any hour by calling [phone]. Do not use MyChart messaging for urgent concerns.    DIET:  We do recommend a small meal at first, but then you may proceed to your regular diet.  Drink plenty of fluids but you should avoid alcoholic beverages for 24 hours.  MEDICATIONS:  Continue present medications.  Please see handouts given to you by your recovery nurse.  Thank you for allowing us to  provide for your healthcare needs today.  ACTIVITY:  You should plan to take it easy for the rest of today and you should NOT DRIVE or use heavy machinery until tomorrow (because of the sedation medicines used during the test).    FOLLOW UP: Our staff will call the number listed on your records 48-72 hours following your procedure to check on you and address any questions or concerns that you may have regarding the information given to you following your procedure. If we do not reach you, we will leave a message.  We will attempt to reach you two times.  During this call, we will ask if you have developed any symptoms of COVID 19. If you develop any symptoms (ie: fever, flu-like symptoms, shortness of breath, cough etc.) before then, please call [phone].  If you test positive for Covid 19 in the 2 weeks post procedure, please call and report this information to us.    If any biopsies were taken you will be contacted by phone or by letter within the next 1-3 weeks.  Please call us at [phone] if you have not heard about the biopsies in 3 weeks.    SIGNATURES/CONFIDENTIALITY: You and/or your care partner have signed paperwork which will be entered into your electronic medical record.  These signatures attest to the fact that that the information above on your After Visit Summary has been reviewed and is understood.  Full responsibility of the   confidentiality of this discharge information lies with you and/or your care-partner.  

## 2020-12-01 NOTE — Progress Notes (Signed)
Report to PACU, RN, vss, BBS= Clear.  

## 2020-12-01 NOTE — Progress Notes (Signed)
[facility] History and Physical   Primary Care Physician:  Janie Morning, DO   Reason for Procedure:  Screening colonoscopy  Plan:    Outpatient screening colonoscopy in the [place]     HPI: Adriana Brown is a 50 y.o. female who presents for average risk colorectal cancer screening.  No complaints today.   Past Medical History:  Diagnosis Date   HOH (hard of hearing)    Hypertension    Pre-eclampsia    Vitamin D deficiency 2013   26 /23.5   Vitamin D deficiency     Past Surgical History:  Procedure Laterality Date   CESAREAN SECTION  09/04/2003   CESAREAN SECTION  08/11/2005   Mirena     replaced 10/25/2015    Prior to Admission medications   Medication Sig Start Date End Date Taking? Authorizing Provider  losartan (COZAAR) 100 MG tablet Take 100 mg by mouth daily. 09/07/20  Yes [provider]  cyclobenzaprine (FLEXERIL) 10 MG tablet Take 1 tablet (10 mg total) by mouth at bedtime as needed for muscle spasms. 09/04/20   Volney American, PA-C  Multiple Vitamin (MULTIVITAMIN) capsule Take 1 capsule by mouth daily.    [provider]  levonorgestrel (MIRENA) 20 MCG/24HR IUD 1 each by Intrauterine route once. Inserted 11/07/05   11/17/10  [provider]    Current Outpatient Medications  Medication Sig Dispense Refill   losartan (COZAAR) 100 MG tablet Take 100 mg by mouth daily.     cyclobenzaprine (FLEXERIL) 10 MG tablet Take 1 tablet (10 mg total) by mouth at bedtime as needed for muscle spasms. 10 tablet 0   Multiple Vitamin (MULTIVITAMIN) capsule Take 1 capsule by mouth daily.     Current Facility-Administered Medications  Medication Dose Route Frequency Provider Last Rate Last Admin   0.9 %  sodium chloride infusion  500 mL Intravenous Once Raenette Sakata, Lajuan Lines, MD       levonorgestrel (MIRENA) 20 MCG/24HR IUD   Intrauterine Once Fontaine, Belinda Block, MD        Allergies as of 12/01/2020   (No Known Allergies)    Family  History  Problem Relation Age of Onset   Hypertension Mother    Hypertension Father    Hypertension Sister    Diabetes Sister    Hypertension Brother    Diabetes Maternal Aunt    Colon cancer Neg Hx    Colon polyps Neg Hx    Esophageal cancer Neg Hx    Stomach cancer Neg Hx    Rectal cancer Neg Hx     Social History   Socioeconomic History   Marital status: Married    Spouse name: Not on file   Number of children: 2   Years of education: college   Highest education level: Not on file  Occupational History   Not on file  Tobacco Use   Smoking status: Never   Smokeless tobacco: Never  Vaping Use   Vaping Use: Never used  Substance and Sexual Activity   Alcohol use: No    Alcohol/week: 0.0 standard drinks   Drug use: No   Sexual activity: Yes    Birth control/protection: I.U.D.    Comment: Mirena inserted 10-25-2015-1st intercourse 50 yo-Fewer than 5 partners  Other Topics Concern   Not on file  Social History Narrative   Occasional soda or chocolate    Social Determinants of Health   Financial Resource Strain: Not on file  Food Insecurity: Not on file  Transportation  Needs: Not on file  Physical Activity: Not on file  Stress: Not on file  Social Connections: Not on file  Intimate Partner Violence: Not on file    Review of Systems: Positive for none All other review of systems negative except as mentioned in the HPI.  Physical Exam: Vital signs BP 119/81   Pulse (!) 110   Temp (!) 97.3 F (36.3 C)   Ht '5\' 2"'$  (1.575 m)   Wt 201 lb (91.2 kg)   SpO2 100%   BMI 36.76 kg/m   General:   Alert,  Well-developed, well-nourished, pleasant and cooperative in NAD Lungs:  Clear throughout to auscultation.   Heart:  Regular rate and rhythm; no murmurs, clicks, rubs,  or gallops. Abdomen:  Soft, nontender and nondistended. Normal bowel sounds.   Neuro/Psych:  Alert and cooperative. Normal mood and affect. A and O x 3

## 2020-12-03 ENCOUNTER — Telehealth: Payer: Self-pay | Admitting: *Deleted

## 2020-12-03 ENCOUNTER — Telehealth: Payer: Self-pay

## 2020-12-03 NOTE — Telephone Encounter (Signed)
  Follow up Call-  Call back number 12/01/2020  Post procedure Call Back phone  # [phone]  Permission to leave phone message Yes  Some recent data might be hidden     Patient questions:  Do you have a fever, pain , or abdominal swelling? No. Pain Score  0 *  Have you tolerated food without any problems? Yes.    Have you been able to return to your normal activities? Yes.    Do you have any questions about your discharge instructions: Diet   No. Medications  No. Follow up visit  No.  Do you have questions or concerns about your Care? No.  Actions: * If pain score is 4 or above: No action needed, pain <4.

## 2020-12-03 NOTE — Telephone Encounter (Signed)
  Follow up Call-  Call back number 12/01/2020  Post procedure Call Back phone  # [phone]  Permission to leave phone message Yes  Some recent data might be hidden     Patient questions:  Message left to call us if necessary.

## 2020-12-06 ENCOUNTER — Encounter: Payer: Self-pay | Admitting: Internal Medicine

## 2020-12-07 ENCOUNTER — Ambulatory Visit: Payer: BC Managed Care – PPO | Admitting: Obstetrics & Gynecology

## 2020-12-07 ENCOUNTER — Other Ambulatory Visit: Payer: Self-pay

## 2020-12-07 ENCOUNTER — Encounter: Payer: Self-pay | Admitting: Obstetrics & Gynecology

## 2020-12-07 VITALS — BP 114/70

## 2020-12-07 DIAGNOSIS — Z30431 Encounter for routine checking of intrauterine contraceptive device: Secondary | ICD-10-CM

## 2020-12-07 NOTE — Progress Notes (Signed)
    Adriana Brown 09/26/1970 PO:[id number]        50 y.o.  G2P2002   RP: Mirena IUD check post insertion on 10/26/2020  HPI: Well since insertion of the new Mirena IUD.  No abnormal vaginal discharge, no vaginal bleeding.  No pelvic pain.  No pain with intercourse.  No fever.   OB History  Gravida Para Term Preterm AB Living  '2 2 2   '$ 0 2  SAB IAB Ectopic Multiple Live Births      0        # Outcome Date GA Lbr Len/2nd Weight Sex Delivery Anes PTL Lv  2 Term           1 Term             Past medical history,surgical history, problem list, medications, allergies, family history and social history were all reviewed and documented in the EPIC chart.   Directed ROS with pertinent positives and negatives documented in the history of present illness/assessment and plan.  Exam:  Vitals:   12/07/20 1556  BP: 114/70   General appearance:  Normal  Abdomen: Normal  Gynecologic exam: Vulva normal.  Speculum:  Cervix normal.  IUD strings visible at EO.     Assessment/Plan:  50 y.o. G2P2002   1. Encounter for routine checking of intrauterine contraceptive device (IUD)  Well on the new Mirena IUD inserted October 26, 2020.  IUD in good position.  No sign of infection.  Patient reassured.  Follow-up when due for annual gynecologic exam.  Princess Bruins MD, 4:39 PM 12/07/2020

## 2020-12-09 ENCOUNTER — Ambulatory Visit: Payer: BC Managed Care – PPO | Admitting: Obstetrics & Gynecology

## 2021-04-08 ENCOUNTER — Ambulatory Visit: Payer: BC Managed Care – PPO | Admitting: Nurse Practitioner

## 2021-04-08 ENCOUNTER — Encounter: Payer: BC Managed Care – PPO | Admitting: Obstetrics and Gynecology

## 2021-04-08 ENCOUNTER — Ambulatory Visit: Payer: BC Managed Care – PPO | Admitting: Obstetrics & Gynecology

## 2021-04-12 ENCOUNTER — Ambulatory Visit (INDEPENDENT_AMBULATORY_CARE_PROVIDER_SITE_OTHER): Payer: BC Managed Care – PPO | Admitting: Obstetrics & Gynecology

## 2021-04-12 ENCOUNTER — Other Ambulatory Visit (HOSPITAL_COMMUNITY)
Admission: RE | Admit: 2021-04-12 | Discharge: 2021-04-12 | Disposition: A | Discharge status: Home / Self Care | Payer: BC Managed Care – PPO | Source: Ambulatory Visit | Attending: Obstetrics & Gynecology | Admitting: Obstetrics & Gynecology

## 2021-04-12 ENCOUNTER — Other Ambulatory Visit: Payer: Self-pay

## 2021-04-12 ENCOUNTER — Encounter: Payer: Self-pay | Admitting: Obstetrics & Gynecology

## 2021-04-12 VITALS — BP 132/92 | HR 117 | Ht 62.0 in | Wt 207.0 lb

## 2021-04-12 DIAGNOSIS — Z30431 Encounter for routine checking of intrauterine contraceptive device: Secondary | ICD-10-CM

## 2021-04-12 DIAGNOSIS — Z01419 Encounter for gynecological examination (general) (routine) without abnormal findings: Secondary | ICD-10-CM | POA: Diagnosis not present

## 2021-04-12 DIAGNOSIS — Z6837 Body mass index (BMI) 37.0-37.9, adult: Secondary | ICD-10-CM

## 2021-04-12 NOTE — Progress Notes (Signed)
Adriana Brown 11-Jun-1970 [id number]   History:    50 y.o. G2P2L2 Married.  Daughter learning spanish.  RP:  Established patient presenting for annual gyn exam   HPI: Well on Mirena IUD x 10/2020.  No BTB.  No pelvic pain.  No pain with IC. Pap Neg/HPV HR Neg 03/2018.  Breasts normal.  Screening mammo 09/2020, will obtain report from [place].  Colono 11/2020.  BMI 37.86.  Health labs with Fam MD.  Past medical history,surgical history, family history and social history were all reviewed and documented in the EPIC chart.  Gynecologic History No LMP recorded. (Menstrual status: IUD).  Obstetric History OB History  Gravida Para Term Preterm AB Living  2 2 2    0 2  SAB IAB Ectopic Multiple Live Births      0        # Outcome Date GA Lbr Len/2nd Weight Sex Delivery Anes PTL Lv  2 Term           1 Term              ROS: A ROS was performed and pertinent positives and negatives are included in the history.  GENERAL: No fevers or chills. HEENT: No change in vision, no earache, sore throat or sinus congestion. NECK: No pain or stiffness. CARDIOVASCULAR: No chest pain or pressure. No palpitations. PULMONARY: No shortness of breath, cough or wheeze. GASTROINTESTINAL: No abdominal pain, nausea, vomiting or diarrhea, melena or bright red blood per rectum. GENITOURINARY: No urinary frequency, urgency, hesitancy or dysuria. MUSCULOSKELETAL: No joint or muscle pain, no back pain, no recent trauma. DERMATOLOGIC: No rash, no itching, no lesions. ENDOCRINE: No polyuria, polydipsia, no heat or cold intolerance. No recent change in weight. HEMATOLOGICAL: No anemia or easy bruising or bleeding. NEUROLOGIC: No headache, seizures, numbness, tingling or weakness. PSYCHIATRIC: No depression, no loss of interest in normal activity or change in sleep pattern.     Exam:   BP (!) 132/92    Pulse (!) 117    Ht 5\' 2"  (1.575 m)    Wt 207 lb (93.9 kg)    SpO2 98%    BMI 37.86 kg/m   Body mass index is 37.86  kg/m.  General appearance : Well developed well nourished female. No acute distress HEENT: Eyes: no retinal hemorrhage or exudates,  Neck supple, trachea midline, no carotid bruits, no thyroidmegaly Lungs: Clear to auscultation, no rhonchi or wheezes, or rib retractions  Heart: Regular rate and rhythm, no murmurs or gallops Breast:Examined in sitting and supine position were symmetrical in appearance, no palpable masses or tenderness,  no skin retraction, no nipple inversion, no nipple discharge, no skin discoloration, no axillary or supraclavicular lymphadenopathy Abdomen: no palpable masses or tenderness, no rebound or guarding Extremities: no edema or skin discoloration or tenderness  Pelvic: Vulva: Normal             Vagina: No gross lesions or discharge  Cervix: No gross lesions or discharge.  [facility]UD strings [facility] at [facility].  Pap reflex done.  Uterus  AV, normal size, shape and consistency, non-tender and mobile  Adnexa  Without masses or tenderness  Anus: Normal   Assessment/Plan:  50 y.o. female for annual exam   1. Encounter for routine gynecological examination with Papanicolaou smear of cervix Well on Mirena IUD x 10/2020.  No BTB.  No pelvic pain.  No pain with IC.  Pap Neg/HPV HR Neg 03/2018.  Breasts normal.  Screening mammo 09/2020, will obtain  report from [place].  Colono 11/2020.  BMI 37.86.  Health labs with Fam MD. - Cytology - PAP( [facility])  2. Encounter for routine checking of intrauterine contraceptive device (IUD) Well on Mirena IUD x 10/2020.  IUD in good position.  3. Class 2 severe obesity due to excess calories with serious comorbidity and body mass index (BMI) of 37.0 to 37.9 in adult [facility])  Recommend a lower calorie/carb diet.  Increase fitness activities. Princess Bruins MD, 2:32 PM 04/12/2021

## 2021-04-14 LAB — CYTOLOGY - PAP: Diagnosis: NEGATIVE

## 2021-04-15 ENCOUNTER — Other Ambulatory Visit: Payer: Self-pay | Admitting: *Deleted

## 2021-04-15 MED ORDER — FLUCONAZOLE 150 MG PO TABS: 150.0000 mg | ORAL_TABLET | Freq: Every day | ORAL | 2 refills | Status: DC

## 2021-04-20 ENCOUNTER — Other Ambulatory Visit: Payer: Self-pay | Admitting: Family Medicine

## 2021-05-10 ENCOUNTER — Other Ambulatory Visit: Payer: Self-pay | Admitting: Family Medicine

## 2021-05-13 ENCOUNTER — Other Ambulatory Visit: Payer: Self-pay | Admitting: Family Medicine

## 2021-05-13 DIAGNOSIS — H938X9 Other specified disorders of ear, unspecified ear: Secondary | ICD-10-CM

## 2021-05-13 DIAGNOSIS — R609 Edema, unspecified: Secondary | ICD-10-CM

## 2021-05-13 DIAGNOSIS — R220 Localized swelling, mass and lump, head: Secondary | ICD-10-CM

## 2021-05-26 ENCOUNTER — Other Ambulatory Visit: Payer: Self-pay | Admitting: Family Medicine

## 2021-05-27 ENCOUNTER — Other Ambulatory Visit: Payer: Self-pay

## 2021-05-27 ENCOUNTER — Ambulatory Visit
Admission: RE | Admit: 2021-05-27 | Discharge: 2021-05-27 | Disposition: A | Discharge status: Home / Self Care | Payer: BC Managed Care – PPO | Source: Ambulatory Visit | Attending: Family Medicine | Admitting: Family Medicine

## 2021-05-27 DIAGNOSIS — H938X9 Other specified disorders of ear, unspecified ear: Secondary | ICD-10-CM

## 2021-05-27 DIAGNOSIS — R609 Edema, unspecified: Secondary | ICD-10-CM

## 2021-05-27 DIAGNOSIS — R220 Localized swelling, mass and lump, head: Secondary | ICD-10-CM

## 2021-05-27 MED ORDER — GADOBENATE DIMEGLUMINE 529 MG/ML IV SOLN
19.0000 mL | Freq: Once | INTRAVENOUS | Status: AC | PRN
  Administered 2021-05-27: 19 mL via INTRAVENOUS

## 2021-05-31 ENCOUNTER — Inpatient Hospital Stay: Admission: RE | Admit: 2021-05-31 | Payer: BC Managed Care – PPO | Source: Ambulatory Visit

## 2021-08-04 ENCOUNTER — Encounter: Payer: Self-pay | Admitting: Obstetrics & Gynecology

## 2021-10-21 LAB — HM MAMMOGRAPHY

## 2021-11-15 ENCOUNTER — Encounter: Payer: Self-pay | Admitting: Family Medicine

## 2021-11-15 ENCOUNTER — Ambulatory Visit: Payer: BC Managed Care – PPO | Admitting: Family Medicine

## 2021-11-15 DIAGNOSIS — E559 Vitamin D deficiency, unspecified: Secondary | ICD-10-CM

## 2021-11-15 DIAGNOSIS — Z975 Presence of (intrauterine) contraceptive device: Secondary | ICD-10-CM | POA: Insufficient documentation

## 2021-11-15 DIAGNOSIS — I10 Essential (primary) hypertension: Secondary | ICD-10-CM | POA: Diagnosis not present

## 2021-11-15 DIAGNOSIS — H9193 Unspecified hearing loss, bilateral: Secondary | ICD-10-CM

## 2021-11-15 NOTE — Patient Instructions (Addendum)
  Sign release of information at the check out desk for last mammogram from [facility] please  Blood pressure has been trending down with significant weight loss-we opted to try lower dose losartan 50 mg-she can simply cut in half what she has.  I asked her to check her blood pressure at least 3 times a week and I would like for her to remain below 135/85-if above this can go back up to 1 dose-otherwise continue lower dose.  As she continues to lose weight I want her to keep an eye on this and if she has blood pressures regularly below 110/65 to let me know or if she has symptoms like lightheadedness/dizziness  My suggestions for jaw pain.  Keep appointment with [facility] on august 11th- ask specifically for their opinion on biopsy given chronicity of pain and to at least help with peace of mind If they do not recommend biopsy- I would ask for management options vs. If they think seeing Dr. August Albino with [facility] would be a good next step- then we could try to press more to get that followed through with.  I am not 100% sure about getting covered but we could see about PET scan - you could also ask [facility] for their thoughts on that  Recommended follow up: Return in about 6 months (around 05/18/2022) for physical or sooner if needed.Schedule b4 you leave.

## 2021-11-15 NOTE — Progress Notes (Addendum)
Phone: [phone]   Subjective:  Patient presents today to establish care.  Prior patient of [facility].  Chief Complaint  Patient presents with   Annual Exam    Not fasting.    swollen area    Pt states she has had a swollen area by her rt ear x1 year.    See problem oriented charting  The following were reviewed and entered/updated in epic: Past Medical History:  Diagnosis Date   HOH (hard of hearing)    Hypertension    Pre-eclampsia    Vitamin D deficiency 04/25/2011   26 /23.5   Patient Active Problem List   Diagnosis Date Noted   Essential hypertension 11/15/2021    Priority: Medium    Vitamin D deficiency 11/15/2021    Priority: Medium    IUD (intrauterine device) in place 11/15/2021    Priority: Low   Hearing loss of both ears 06/25/2011    Priority: Low   Past Surgical History:  Procedure Laterality Date   CESAREAN SECTION  09/04/2003   CESAREAN SECTION  08/11/2005   Mirena     inserted 10-26-20    Family History  Problem Relation Age of Onset   Hypertension Mother    Hypertension Father    Stroke Father        age 70   Hypertension Sister    Hypertension Brother    Diabetes Brother    Other Maternal Grandmother        88 in 2023   Diabetes Maternal Aunt    Aneurysm Maternal Aunt        brain   Colon cancer Neg Hx    Colon polyps Neg Hx    Esophageal cancer Neg Hx    Stomach cancer Neg Hx    Rectal cancer Neg Hx     Medications- reviewed and updated Current Outpatient Medications  Medication Sig Dispense Refill   losartan (COZAAR) 100 MG tablet Take 100 mg by mouth daily.     Current Facility-Administered Medications  Medication Dose Route Frequency Provider Last Rate Last Admin   levonorgestrel (MIRENA) 20 MCG/24HR IUD   Intrauterine Once Fontaine, Belinda Block, MD        Allergies-reviewed and updated No Known Allergies  Social History   Social History Narrative   Married in 1997. 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 Oropharynx normal. TM normal. Eyes: sclera and lids normal, PERRLA Neck: no thyromegaly, no cervical lymphadenopathy other than-  Nontender lymphadenopathy below right angle of jaw approximately 2 x 1 cm CV: RRR no murmurs rubs or gallops Lungs: CTAB no crackles, wheeze, rhonchi Abdomen: soft/nontender/nondistended/normal bowel sounds. No rebound or guarding.  Ext: no edema Skin: warm, dry   Assessment and Plan:    #Swollen area in the right ear for 1 year/jaw pain on right S: started may 2022- also had a fall at work in may 2022 falling backwards last month- 2 weeks later noted swelling in face- grandmother noted. has  seen PCP- told parotitis and given antibiotic and prednisone- no improvement- issues would come and go with swelling.  Ct maxillofaxial 11/10/20- told negative study. Then on  MRI  trigeminal and neck w wo contrast2/3/23 with Dr. Theda Sers - showed "Normal MRI of the face and neck. Specifically, no abnormality of the right parotid gland or along the course of the trigeminal nerves."  -has also gone to ENT twice- [facility]- saw Pietro Cassis on 11/24/20- essentially advised warm compresses and follow up with MD - she then saw Dr. Fredric Dine on 12/07/20. Has had normal bloodwork reported by ENT. With reassuring prior imaging was told no further workup as long as stable. Later saw allergist. Has also seen dermatologist. Has had pressure in this area/pain when it swells more. Pain up to 3/10 at its worst. Later seen 09/28/21 for second opinion with Dr. Rockne Menghini- has appointment with [facility] on august 11. Feels like  stress has been lower plus did a course of ibuprofen at time of [facility] visit for 2 weeks- she took 1 a day until last week. Not worse with chewing A/P: Swollen area under her mandible with associated pain up to 3 out of 10 around her jaw/in this area-potentially lymph node about 2 x 1 cm and seems larger than the left side.  Has had extensive evaluation already but does not have a clear answer for the cause of the swelling or for the pain she experiences. - no wbc elevation on labs 03-16-21 at 9.1 k  From AVS  "My suggestions for jaw pain.  Keep appointment with [facility] on august 11th- ask specifically for their opinion on biopsy given chronicity of pain and to at least help with peace of mind If they do not recommend biopsy- I would ask for management options vs. If they think seeing Dr. August Albino with [facility] would be a good next step- then we could try to press more to get that followed through with.  I am not 100% sure about getting covered but we could see about PET scan - you could also ask [facility] for their thoughts on that"  #Rash on neck and shoulders- neck was the worst- has started to improve- less bumpy- still mild redness- mild warmth. Antihistamine and aloe vera burn gel helps some. SPF 70- was out in sun for 6 hours and in and out of water. Didn't happen until last day- did not switch sunscreens. No new medications in that time frame.  -suspect photosensitive rash- continue to monitor and avoid sun exposure  #hypertension S: medication: losartan '100mg'$  Home readings #s: 100s or 110s/70s or low 80s  BP Readings from Last 3 Encounters:  11/15/21 110/60  04/12/21 (!) 132/92  12/07/20 114/70  A/P: Blood pressure has been trending down with significant weight loss-we opted to try lower dose losartan 50 mg-she can simply cut in half what she has.  I asked her to check her blood pressure at least 3 times a week and I would like for her to remain below 135/85-if above this can go back up to 1  dose-otherwise continue lower dose.  As she continues to lose weight I want her to keep an eye on this and if she has blood pressures regularly below 110/65 to let me know or if she has symptoms like lightheadedness/dizziness  # Hyperglycemia/insulin resistance/prediabetes- a1c as high as 6.3 11-23/22 S:  Medication: none Exercise and diet-  Optivia program in 2022 after prediabetes diagnosis and lost 40 lbs with goal another 25 down  so around 135-140 on home scales  A/P: she reports improvement in a1c on last check- we will wait to check until CPE  #Vitamin D deficiency S: Medication: none at present- #s had improved per prior PCP A/P: we will check vitamin D at CPE  #Reviewed lipids- LDL 104, hdl 48, chol 169 on 03/16/21.    Recommended follow up: Return in about 6 months (around 05/18/2022) for physical or sooner if needed.Schedule b4 you leave. Future Appointments  Date Time Provider Chance  04/13/2022  3:30 PM Princess Bruins, MD [facility] None   Time Spent: 64 minutes of total time (3:06 PM- 4:10 PM) was spent on the date of the encounter performing the following actions: chart review prior to seeing the patient, obtaining history and reviewing chart together, performing a medically necessary exam, counseling on the treatment plan and next steps as well as stress of workup over last year without finding answers, and documenting in our EHR.   Return precautions advised. Garret Reddish, MD

## 2021-11-27 ENCOUNTER — Encounter: Payer: Self-pay | Admitting: Family Medicine

## 2021-11-27 MED ORDER — LOSARTAN POTASSIUM 50 MG PO TABS: 50.0000 mg | ORAL_TABLET | Freq: Every day | ORAL | 3 refills | Status: DC

## 2022-01-16 ENCOUNTER — Encounter: Payer: Self-pay | Admitting: *Deleted

## 2022-01-26 ENCOUNTER — Encounter: Payer: Self-pay | Admitting: Family Medicine

## 2022-01-30 ENCOUNTER — Other Ambulatory Visit: Payer: Self-pay

## 2022-01-30 MED ORDER — HYDROCHLOROTHIAZIDE 12.5 MG PO TABS: 12.5000 mg | ORAL_TABLET | Freq: Every day | ORAL | 3 refills | Status: DC

## 2022-03-03 ENCOUNTER — Telehealth: Payer: BC Managed Care – PPO | Admitting: Internal Medicine

## 2022-03-03 ENCOUNTER — Encounter: Payer: Self-pay | Admitting: Internal Medicine

## 2022-03-03 ENCOUNTER — Encounter: Payer: Self-pay | Admitting: Family Medicine

## 2022-03-03 VITALS — BP 112/84 | Temp 98.8°F

## 2022-03-03 DIAGNOSIS — U071 COVID-19: Secondary | ICD-10-CM

## 2022-03-03 MED ORDER — NIRMATRELVIR/RITONAVIR (PAXLOVID)TABLET: 3.0000 | ORAL_TABLET | Freq: Two times a day (BID) | ORAL | 0 refills | Status: AC

## 2022-03-03 MED ORDER — FLUTICASONE PROPIONATE 50 MCG/ACT NA SUSP: 2.0000 | Freq: Every day | NASAL | 6 refills | Status: DC

## 2022-03-03 NOTE — Progress Notes (Signed)
[facility] [employer]:  [phone]   Routine  [facility] Office Visit  Patient:  Adriana Brown      Age: 51 y.o.       Sex:  female  Date:   03/03/2022  PCP:    Marin Olp, MD    [place] Provider: Loralee Pacas, MD  Assessment/Plan:   Today's Telemedicine visit was conducted via Video for 2m16s after consent for telemedicine was obtained:  Video connection was lost when less than 50% of the duration of the visit was complete, at which time the remainder of the visit was completed via audio only. She endorsed very poor network service. Location of the Healthcare Provider:   [facility] [address] / Client: [phone] Video Call Participants - all identities confirmed visually and verbally: Healthcare Provider:  RLoralee Pacas MD  Patient / Client: [name] Patient support:  None      TNarawas seen today for nasal congestion, fatigue, lethargic, cough and covid positive.  COVID -     nirmatrelvir/ritonavir EUA; Take 3 tablets by mouth 2 (two) times daily for 5 days. (Take nirmatrelvir 150 mg two tablets twice daily for 5 days and ritonavir 100 mg one tablet twice daily for 5 days) Patient GFR is good and she denies pregnancy has iud  Dispense: 30 tablet; Refill: 0 -     Fluticasone Propionate; Place 2 sprays into both nostrils daily.  Dispense: 16 g; Refill: 6    Today's key discussion points - also in After Visit Summary (AVS) Common side effects, risks, benefits, and alternatives for medications and treatment plan prescribed today were discussed, and she expressed understanding of the given instructions.  Medication list was reconciled and patient instructions and summary information was documented and made available for her to review in the AVS (see AVS).  This note is also available to  patient for review for accuracy and understanding. She was encouraged to contact our office by phone or message via MyChart if she has any questions or concerns regarding our treatment plan (see AVS).  We discussed red flag symptoms and signs in detail and when to call the office or go to ER if her condition worsens (see AFTER VISIT SUMMARY). She expressed understanding.  No barriers to understanding were identified     Subjective:   Adriana SIMSONis a 51y.o. female with PMH significant for: Past Medical History:  Diagnosis Date   HOH (hard of hearing)    Hypertension    Pre-eclampsia    Vitamin D deficiency 04/25/2011   26 /23.5     She is presenting today with: Chief Complaint  Patient presents with   Nasal Congestion   Fatigue   Lethargic   Cough    Slight.    Covid Positive    Took at home test last night.     Additional physician-collected and physician-reviewed history: See Assessment/Plan section for today's problem updates to charted history (under Overview: and Assessment & Plan for each problem)  Positive COVID test at home last night Started with sore throat- then yesterday lots of nasal congestion so took the COVID test Fever was 101 last night but down now Has been taking medications to get it down like tylenol and aleve for the fever Paxlovid  Objective:  Physical Exam: BP 112/84 (BP Location: Left Arm) Comment: taken at home.  Temp 98.8 F (37.1 C) (Oral) Comment: taken at home  She is a polite, friendly, and genuine person Constitutional: NAD, AAO, ill-appearing Neuro: alert, no focal deficit obvious, articulate speech Psych: normal mood, behavior, thought content  Problem-specific physical exam findings:

## 2022-03-03 NOTE — Patient Instructions (Addendum)
It was a pleasure seeing you today! I truly hope you feel like you received 5 star service and please let me know if there is anything I can improve.  Loralee Pacas, MD   Today the plan is...take the paxlovid regularly, and use simply saline rinses with flonase for congestion.  Rest and quarantine.  Take off work 7 days.  COVID -     nirmatrelvir/ritonavir EUA; Take 3 tablets by mouth 2 (two) times daily for 5 days. (Take nirmatrelvir 150 mg two tablets twice daily for 5 days and ritonavir 100 mg one tablet twice daily for 5 days) Patient GFR is good and she denies pregnancy has iud  Dispense: 30 tablet; Refill: 0 -     Fluticasone Propionate; Place 2 sprays into both nostrils daily.  Dispense: 16 g; Refill: 6        '[x]'$  RETURN TO CLINIC: No follow-ups on file.   - If you are not doing well: RETURN to the office sooner. - Please bring all your medicines to each appointment.  - If your condition begins to worsen or become severe:  GO to the ER.  '[x]'$  QUESTIONS/CONCERNS:  If you have follow-up questions / concerns:  - CLINICAL: please contact me via phone [phone] OR MyChart messaging  - [place] you will be contacted with the lab results as soon as they are available. For any labs or imaging tests, we will call you if the results are significantly abnormal.  Most normal results will be posted to myChart as soon as they are available and I will comment on them there within 2-3 business days.  The fastest way to get your results is to activate your My Chart account. Instructions are located on the last page of this paperwork. If you have not heard from [place] regarding the results in 2 weeks, please contact this office.  - BILLING: xray and lab orders are billed from separate companies and questions./concerns should be directed to the [facility].  For visit charges please discuss with our administrative services

## 2022-04-06 ENCOUNTER — Encounter: Payer: Self-pay | Admitting: *Deleted

## 2022-04-13 ENCOUNTER — Ambulatory Visit: Payer: BC Managed Care – PPO | Admitting: Obstetrics & Gynecology

## 2022-04-25 ENCOUNTER — Encounter: Payer: Self-pay | Admitting: Obstetrics & Gynecology

## 2022-04-25 ENCOUNTER — Ambulatory Visit (INDEPENDENT_AMBULATORY_CARE_PROVIDER_SITE_OTHER): Payer: BC Managed Care – PPO | Admitting: Obstetrics & Gynecology

## 2022-04-25 VITALS — BP 110/68 | HR 89 | Ht 61.25 in | Wt 161.0 lb

## 2022-04-25 DIAGNOSIS — Z01419 Encounter for gynecological examination (general) (routine) without abnormal findings: Secondary | ICD-10-CM | POA: Diagnosis not present

## 2022-04-25 DIAGNOSIS — Z30431 Encounter for routine checking of intrauterine contraceptive device: Secondary | ICD-10-CM

## 2022-04-25 NOTE — Progress Notes (Signed)
Adriana Brown 05/06/1970 [id number]   History:    52 y.o. G2P2L2 Married.  Daughter at [facility].   RP:  Established patient presenting for annual gyn exam    HPI: Well on Mirena IUD x 10/2020.  No BTB.  No pelvic pain.  No pain with IC. Pap Neg 03/2021.  HPV HR Neg 2019.  No h/o abnormal Pap. Repeat Pap at 5 years.  Breasts normal.  Screening mammo Neg 09/2021. Colono 11/2020.  BMI decreased to 30.17. Nutrition and fitness program.  Health labs with Fam MD. COLONOSCOPY: 12-01-20.  Pt declines flu vaccine.   Past medical history,surgical history, family history and social history were all reviewed and documented in the EPIC chart.  Gynecologic History No LMP recorded. (Menstrual status: IUD).  Obstetric History OB History  Gravida Para Term Preterm AB Living  '2 2 2   '$ 0 2  SAB IAB Ectopic Multiple Live Births      0        # Outcome Date GA Lbr Len/2nd Weight Sex Delivery Anes PTL Lv  2 Term           1 Term              ROS: A ROS was performed and pertinent positives and negatives are included in the history. GENERAL: No fevers or chills. HEENT: No change in vision, no earache, sore throat or sinus congestion. NECK: No pain or stiffness. CARDIOVASCULAR: No chest pain or pressure. No palpitations. PULMONARY: No shortness of breath, cough or wheeze. GASTROINTESTINAL: No abdominal pain, nausea, vomiting or diarrhea, melena or bright red blood per rectum. GENITOURINARY: No urinary frequency, urgency, hesitancy or dysuria. MUSCULOSKELETAL: No joint or muscle pain, no back pain, no recent trauma. DERMATOLOGIC: No rash, no itching, no lesions. ENDOCRINE: No polyuria, polydipsia, no heat or cold intolerance. No recent change in weight. HEMATOLOGICAL: No anemia or easy bruising or bleeding. NEUROLOGIC: No headache, seizures, numbness, tingling or weakness. PSYCHIATRIC: No depression, no loss of interest in normal activity or change in sleep pattern.     Exam:   BP 110/68   Pulse 89    Ht 5' 1.25" (1.556 m)   Wt 161 lb (73 kg)   SpO2 98%   BMI 30.17 kg/m   Body mass index is 30.17 kg/m.  General appearance : Well developed well nourished female. No acute distress HEENT: Eyes: no retinal hemorrhage or exudates,  Neck supple, trachea midline, no carotid bruits, no thyroidmegaly Lungs: Clear to auscultation, no rhonchi or wheezes, or rib retractions  Heart: Regular rate and rhythm, no murmurs or gallops Breast:Examined in sitting and supine position were symmetrical in appearance, no palpable masses or tenderness,  no skin retraction, no nipple inversion, no nipple discharge, no skin discoloration, no axillary or supraclavicular lymphadenopathy Abdomen: no palpable masses or tenderness, no rebound or guarding Extremities: no edema or skin discoloration or tenderness  Pelvic: Vulva: Normal             Vagina: No gross lesions or discharge  Cervix: No gross lesions or discharge.  IUD strings visible at [facility].  Uterus  AV, normal size, shape and consistency, non-tender and mobile  Adnexa  Without masses or tenderness  Anus: Normal   Assessment/Plan:  52 y.o. female for annual exam   1. Well female exam with routine gynecological exam Well on Mirena IUD x 10/2020.  No BTB.  No pelvic pain.  No pain with IC. Pap Neg 03/2021.  HPV  HR Neg 2019.  No h/o abnormal Pap. Repeat Pap at 3 years.  Breasts normal.  Screening mammo Neg 09/2021. Colono 11/2020.  BMI decreased to 30.17. Nutrition and fitness program.  Health labs with Fam MD. COLONOSCOPY: 12-01-20.  Pt declines flu vaccine.   2. Encounter for routine checking of intrauterine contraceptive device (IUD) Well on Mirena IUD x 10/2020.  No BTB.  No pelvic pain.  No pain with IC.   Other orders - ibuprofen (ADVIL) 400 MG tablet; TAKE 1 TABLET (400 MG TOTAL) BY MOUTH EVERY 8 (EIGHT) HOURS AS NEEDED FOR PAIN FOR UP TO 14 DAYS - levonorgestrel (MIRENA) 20 MCG/DAY IUD; 1 each by Intrauterine route once.   Princess Bruins MD, 4:14  PM

## 2022-05-04 ENCOUNTER — Encounter: Payer: Self-pay | Admitting: Family Medicine

## 2022-05-04 ENCOUNTER — Ambulatory Visit (INDEPENDENT_AMBULATORY_CARE_PROVIDER_SITE_OTHER): Payer: BC Managed Care – PPO | Admitting: Family Medicine

## 2022-05-04 VITALS — BP 120/80 | HR 72 | Temp 97.7°F | Ht 61.25 in | Wt 159.6 lb

## 2022-05-04 DIAGNOSIS — Z0000 Encounter for general adult medical examination without abnormal findings: Secondary | ICD-10-CM

## 2022-05-04 DIAGNOSIS — I10 Essential (primary) hypertension: Secondary | ICD-10-CM | POA: Diagnosis not present

## 2022-05-04 DIAGNOSIS — E559 Vitamin D deficiency, unspecified: Secondary | ICD-10-CM | POA: Diagnosis not present

## 2022-05-04 DIAGNOSIS — R739 Hyperglycemia, unspecified: Secondary | ICD-10-CM | POA: Diagnosis not present

## 2022-05-04 DIAGNOSIS — Z13220 Encounter for screening for lipoid disorders: Secondary | ICD-10-CM

## 2022-05-04 DIAGNOSIS — Z23 Encounter for immunization: Secondary | ICD-10-CM | POA: Diagnosis not present

## 2022-05-04 NOTE — Addendum Note (Signed)
Addended by: Clyde Lundborg A on: 05/04/2022 04:46 PM   Modules accepted: Orders
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
## 2022-05-04 NOTE — Patient Instructions (Addendum)
Tdap today!   Schedule a lab visit at the check out desk within 2 weeks. Return for future fasting labs meaning nothing but water after midnight please. Ok to take your medications with water.   Recommended follow up: Return in about 1 year (around 05/05/2023) for physical or sooner if needed.Schedule b4 you leave.

## 2022-05-08 ENCOUNTER — Other Ambulatory Visit (INDEPENDENT_AMBULATORY_CARE_PROVIDER_SITE_OTHER): Payer: BC Managed Care – PPO

## 2022-05-08 ENCOUNTER — Encounter: Payer: Self-pay | Admitting: Family Medicine

## 2022-05-08 DIAGNOSIS — E559 Vitamin D deficiency, unspecified: Secondary | ICD-10-CM | POA: Diagnosis not present

## 2022-05-08 DIAGNOSIS — R739 Hyperglycemia, unspecified: Secondary | ICD-10-CM | POA: Diagnosis not present

## 2022-05-08 DIAGNOSIS — Z13220 Encounter for screening for lipoid disorders: Secondary | ICD-10-CM

## 2022-05-08 DIAGNOSIS — Z0000 Encounter for general adult medical examination without abnormal findings: Secondary | ICD-10-CM

## 2022-05-08 DIAGNOSIS — I10 Essential (primary) hypertension: Secondary | ICD-10-CM | POA: Diagnosis not present

## 2022-05-08 LAB — COMPREHENSIVE METABOLIC PANEL
ALT: 12 U/L (ref 0–35)
AST: 15 U/L (ref 0–37)
Albumin: 4.3 g/dL (ref 3.5–5.2)
Alkaline Phosphatase: 67 U/L (ref 39–117)
BUN: 17 mg/dL (ref 6–23)
CO2: 28 mEq/L (ref 19–32)
Calcium: 9.7 mg/dL (ref 8.4–10.5)
Chloride: 105 mEq/L (ref 96–112)
Creatinine, Ser: 0.95 mg/dL (ref 0.40–1.20)
GFR: 70.22 mL/min (ref >60.00)
Glucose, Bld: 104 mg/dL — ABNORMAL HIGH (ref 70–99)
Potassium: 4.1 mEq/L (ref 3.5–5.1)
Sodium: 140 mEq/L (ref 135–145)
Total Bilirubin: 0.4 mg/dL (ref 0.2–1.2)
Total Protein: 6.9 g/dL (ref 6.0–8.3)

## 2022-05-08 LAB — CBC WITH DIFFERENTIAL/PLATELET
Basophils Absolute: 0 10*3/uL (ref 0.0–0.1)
Basophils Relative: 0.5 % (ref 0.0–3.0)
Eosinophils Absolute: 0.2 10*3/uL (ref 0.0–0.7)
Eosinophils Relative: 3.5 % (ref 0.0–5.0)
HCT: 40.6 % (ref 36.0–46.0)
Hemoglobin: 13.1 g/dL (ref 12.0–15.0)
Lymphocytes Relative: 26.9 % (ref 12.0–46.0)
Lymphs Abs: 1.7 10*3/uL (ref 0.7–4.0)
MCHC: 32.3 g/dL (ref 30.0–36.0)
MCV: 91.8 fl (ref 78.0–100.0)
Monocytes Absolute: 0.5 10*3/uL (ref 0.1–1.0)
Monocytes Relative: 7.2 % (ref 3.0–12.0)
Neutro Abs: 4 10*3/uL (ref 1.4–7.7)
Neutrophils Relative %: 61.9 % (ref 43.0–77.0)
Platelets: 229 10*3/uL (ref 150.0–400.0)
RBC: 4.43 Mil/uL (ref 3.87–5.11)
RDW: 14.7 % (ref 11.5–15.5)
WBC: 6.5 10*3/uL (ref 4.0–10.5)

## 2022-05-08 LAB — LIPID PANEL
Cholesterol: 166 mg/dL (ref 0–200)
HDL: 60.7 mg/dL (ref >39.00)
LDL Cholesterol: 92 mg/dL (ref 0–99)
NonHDL: 104.92
Total CHOL/HDL Ratio: 3
Triglycerides: 64 mg/dL (ref 0.0–149.0)
VLDL: 12.8 mg/dL (ref 0.0–40.0)

## 2022-05-08 LAB — VITAMIN D 25 HYDROXY (VIT D DEFICIENCY, FRACTURES): VITD: 39.62 ng/mL (ref 30.00–100.00)

## 2022-05-08 LAB — HEMOGLOBIN A1C: Hgb A1c MFr Bld: 5.8 % (ref 4.6–6.5)

## 2022-05-26 ENCOUNTER — Encounter: Payer: Self-pay | Admitting: Family Medicine

## 2022-07-02 ENCOUNTER — Encounter: Payer: Self-pay | Admitting: Family Medicine

## 2022-07-02 DIAGNOSIS — M79676 Pain in unspecified toe(s): Secondary | ICD-10-CM

## 2022-07-06 ENCOUNTER — Ambulatory Visit (INDEPENDENT_AMBULATORY_CARE_PROVIDER_SITE_OTHER): Payer: BC Managed Care – PPO

## 2022-07-06 ENCOUNTER — Ambulatory Visit: Payer: BC Managed Care – PPO | Admitting: Family Medicine

## 2022-07-06 VITALS — BP 130/88 | HR 84 | Ht 61.0 in | Wt 173.0 lb

## 2022-07-06 DIAGNOSIS — M79675 Pain in left toe(s): Secondary | ICD-10-CM

## 2022-07-06 NOTE — Progress Notes (Signed)
   Shirlyn Goltz, PhD, LAT, ATC acting as a scribe for Lynne Leader, MD.  Subjective:    CC: L foot pain  HPI: Pt is a 52 y/o female c/o L foot pain x 6+ wks. Pt hit her 5th toe on the door frame when trying to use the restroom in the middle of the night. Pt locates pain to her L 5th toe. Pt notes the toe is no longer extremely painful, but constant soreness.  L foot swelling: yes Aggravates: walking Treatments tried: ice, wrapping toe, deep blue  Pertinent review of Systems: No fevers or chills  Relevant historical information: Hypertension   Objective:    Vitals:   07/06/22 1511  BP: 130/88  Pulse: 84  SpO2: 100%   General: Well Developed, well nourished, and in no acute distress.   MSK: Left foot some swelling at fifth toe.  Otherwise normal-appearing Mildly tender to palpation fifth toe. Normal foot and ankle motion.  Pulses capillary fill and sensation intact distally.  Lab and Radiology Results  X-ray images left fifth toe obtained today personally and independently interpreted No acute fractures are visible. Await formal radiology review   Impression and Recommendations:    Assessment and Plan: 52 y.o. female with left fifth toe pain occurring persistently now 6 weeks following original injury.  I do not see a fracture on x-ray today however radiology overread is still pending.  Plan for continued conservative management with occasional buddy taping or at least wearing shoes while in the house.  Recommend Voltaren gel and ice as needed.  Expect this may take another few months to fully resolve.  Recommend giving it another 6 weeks.  PDMP not reviewed this encounter. Orders Placed This Encounter  Procedures   DG Toe 5th Left    Standing Status:   Future    Number of Occurrences:   1    Standing Expiration Date:   07/06/2023    Order Specific Question:   Reason for Exam (SYMPTOM  OR DIAGNOSIS REQUIRED)    Answer:   left 5th toe pain    Order Specific  Question:   Is patient pregnant?    Answer:   No    Order Specific Question:   Preferred imaging location?    Answer:   [facility]   No orders of the defined types were placed in this encounter.   Discussed warning signs or symptoms. Please see discharge instructions. Patient expresses understanding.   The above documentation has been reviewed and is accurate and complete Lynne Leader, M.D.

## 2022-07-06 NOTE — Patient Instructions (Addendum)
Thank you for coming in today.   I suggest giving it another 6 weeks   Please use Voltaren gel (Generic Diclofenac Gel) up to 4x daily for pain as needed.  This is available over-the-counter as both the name brand Voltaren gel and the generic diclofenac gel.   Let me know if not better.

## 2022-07-10 NOTE — Progress Notes (Signed)
Left fifth toe x-ray shows no fractures.

## 2022-07-27 ENCOUNTER — Encounter: Payer: Self-pay | Admitting: Family Medicine

## 2022-08-03 ENCOUNTER — Encounter: Payer: Self-pay | Admitting: Family

## 2022-08-03 ENCOUNTER — Ambulatory Visit: Payer: BC Managed Care – PPO | Admitting: Family

## 2022-08-03 VITALS — BP 118/78 | HR 73 | Temp 98.7°F | Resp 16 | Ht 62.0 in | Wt 171.0 lb

## 2022-08-03 DIAGNOSIS — J020 Streptococcal pharyngitis: Secondary | ICD-10-CM

## 2022-08-03 LAB — POCT RAPID STREP A (OFFICE): Rapid Strep A Screen: POSITIVE — AB

## 2022-08-03 MED ORDER — AMOXICILLIN 500 MG PO CAPS
500.0000 mg | ORAL_CAPSULE | Freq: Two times a day (BID) | ORAL | 0 refills | Status: AC
Start: 2022-08-03 — End: 2022-08-13

## 2022-08-03 NOTE — Progress Notes (Signed)
   Patient ID: Adriana Brown, female    DOB: 1970-04-25, 52 y.o.   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       Assessment & Plan:  1. Strep throat - sending AMOX, advised on use & SE, Advised pt to take Tylenol 1,000mg  sore throat pain, swelling, and fever. Gargle with warm salt water several tid. OK to use OTC Chloraseptic spray and/or throat lozenges prn. Drink plenty of water.   - POCT rapid strep A  Subjective:    Outpatient Medications Prior to Visit  Medication Sig Dispense Refill   hydrochlorothiazide (HYDRODIURIL) 12.5 MG tablet Take 1 tablet (12.5 mg total) by mouth daily. 90 tablet 3   ibuprofen (ADVIL) 400 MG tablet TAKE 1 TABLET (400 MG TOTAL) BY MOUTH EVERY 8 (EIGHT) HOURS AS NEEDED FOR PAIN FOR UP TO 14 DAYS     levonorgestrel (MIRENA) 20 MCG/DAY IUD 1 each by Intrauterine route once.     meloxicam (MOBIC) 15 MG tablet Take 1 tablet every day by oral route with meal(s) for 30 days.     No facility-administered medications prior to visit.   Past Medical History:  Diagnosis Date   HOH (hard of hearing)    Hypertension    Pre-eclampsia    Vitamin D deficiency 04/25/2011   26 /23.5   Past Surgical History:  Procedure Laterality Date   CESAREAN SECTION  09/04/2003   CESAREAN SECTION  08/11/2005   Mirena     inserted 10-26-20   No Known Allergies    Objective:    Physical Exam Vitals and nursing note reviewed.  Constitutional:      Appearance: Normal appearance.  HENT:     Right Ear: Ear canal normal. No decreased hearing noted. A middle ear effusion is present. Tympanic membrane is erythematous (mild).     Left Ear: Ear canal normal.  Cardiovascular:     Rate and Rhythm: Normal rate and regular rhythm.  Pulmonary:     Effort: Pulmonary  effort is normal.     Breath sounds: Normal breath sounds.  Musculoskeletal:        General: Normal range of motion.  Skin:    General: Skin is warm and dry.  Neurological:     Mental Status: She is alert.  Psychiatric:        Mood and Affect: Mood normal.        Behavior: Behavior normal.    BP 118/78   Pulse 73   Temp 98.7 F (37.1 C) (Temporal)   Resp 16   Ht 5\' 2"  (1.575 m)   Wt 171 lb (77.6 kg)   SpO2 100%   BMI 31.28 kg/m  Wt Readings from Last 3 Encounters:  08/03/22 171 lb (77.6 kg)  07/06/22 173 lb (78.5 kg)  05/04/22 159 lb 9.6 oz (72.4 kg)      Dulce Sellar, NP

## 2022-10-23 LAB — HM MAMMOGRAPHY

## 2022-11-08 ENCOUNTER — Other Ambulatory Visit: Payer: BC Managed Care – PPO

## 2022-11-10 ENCOUNTER — Other Ambulatory Visit: Payer: BC Managed Care – PPO

## 2022-11-13 ENCOUNTER — Encounter: Payer: Self-pay | Admitting: Family Medicine

## 2022-11-13 ENCOUNTER — Other Ambulatory Visit (INDEPENDENT_AMBULATORY_CARE_PROVIDER_SITE_OTHER): Payer: BC Managed Care – PPO

## 2022-11-13 ENCOUNTER — Other Ambulatory Visit: Payer: Self-pay

## 2022-11-13 DIAGNOSIS — R7303 Prediabetes: Secondary | ICD-10-CM | POA: Diagnosis not present

## 2022-11-13 LAB — HEMOGLOBIN A1C: Hgb A1c MFr Bld: 5.8 % (ref 4.6–6.5)

## 2022-11-15 ENCOUNTER — Ambulatory Visit: Payer: BC Managed Care – PPO | Admitting: Family Medicine

## 2023-01-21 ENCOUNTER — Other Ambulatory Visit: Payer: Self-pay | Admitting: Family Medicine

## 2023-04-09 ENCOUNTER — Encounter: Payer: Self-pay | Admitting: Family Medicine

## 2023-04-09 ENCOUNTER — Ambulatory Visit: Payer: BC Managed Care – PPO | Admitting: Family Medicine

## 2023-04-09 VITALS — BP 120/70 | HR 97 | Temp 97.6°F | Ht 62.0 in | Wt 197.0 lb

## 2023-04-09 DIAGNOSIS — I10 Essential (primary) hypertension: Secondary | ICD-10-CM

## 2023-04-09 DIAGNOSIS — E669 Obesity, unspecified: Secondary | ICD-10-CM | POA: Diagnosis not present

## 2023-04-09 DIAGNOSIS — E559 Vitamin D deficiency, unspecified: Secondary | ICD-10-CM

## 2023-04-09 DIAGNOSIS — R739 Hyperglycemia, unspecified: Secondary | ICD-10-CM | POA: Diagnosis not present

## 2023-04-09 MED ORDER — LOSARTAN POTASSIUM 100 MG PO TABS
100.0000 mg | ORAL_TABLET | Freq: Every day | ORAL | 3 refills | Status: AC
Start: 2023-04-09 — End: ?

## 2023-04-09 NOTE — Patient Instructions (Addendum)
blood pressure is well controlled after switching back to losartan 100 mg that she was on when we first met- she had been able to come off with weight loss and go to very low dose hydrochlorothiazide 12.5 mg. She plans to restart her weight loss efforts and she will need to let me know if blood pressure trends down again  Magnesium 400 mg - 500 mg at night   Recommended follow up: Return for next already scheduled visit or sooner if needed.
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
## 2023-04-09 NOTE — Assessment & Plan Note (Signed)
S: medication: losartan 100 mg reportedly I/o hydrochlorothiazide 12.5 mg (had switched to this in past with lower weight)  Home readings #s: on the 10th 156/99 at 9 pm- took hydrochlorothiazide earlier that day but at that point  with pressure high took losartan 100 mg also and blood pressure went down to 134/88 on repeat 45 minutes later. Next morning was 114/86 but by night was 149/108. On 12th morning controlled and 13th  (instead of doing both she switched over to just hydrochlorothiazide) 130/86. Most recently this morning 115/85 BP Readings from Last 3 Encounters:  04/09/23 120/70  08/03/22 118/78  07/06/22 130/88  A/P: blood pressure is well controlled after switching back to losartan 100 mg that she was on when we first met- she had been able to come off with weight loss and go to very low dose hydrochlorothiazide 12.5 mg. She plans to restart her weight loss efforts and she will need to let me know if blood pressure trends down again

## 2023-05-07 ENCOUNTER — Ambulatory Visit (INDEPENDENT_AMBULATORY_CARE_PROVIDER_SITE_OTHER): Payer: 59 | Admitting: Family Medicine

## 2023-05-07 ENCOUNTER — Encounter: Payer: Self-pay | Admitting: Family Medicine

## 2023-05-07 VITALS — BP 120/80 | HR 76 | Temp 97.2°F | Ht 62.0 in | Wt 195.0 lb

## 2023-05-07 DIAGNOSIS — Z130 Encounter for screening for diseases of the blood and blood-forming organs and certain disorders involving the immune mechanism: Secondary | ICD-10-CM

## 2023-05-07 DIAGNOSIS — Z0000 Encounter for general adult medical examination without abnormal findings: Secondary | ICD-10-CM | POA: Diagnosis not present

## 2023-05-07 DIAGNOSIS — E669 Obesity, unspecified: Secondary | ICD-10-CM | POA: Diagnosis not present

## 2023-05-07 DIAGNOSIS — Z13220 Encounter for screening for lipoid disorders: Secondary | ICD-10-CM

## 2023-05-07 DIAGNOSIS — Z131 Encounter for screening for diabetes mellitus: Secondary | ICD-10-CM

## 2023-05-07 DIAGNOSIS — E559 Vitamin D deficiency, unspecified: Secondary | ICD-10-CM

## 2023-05-07 NOTE — Patient Instructions (Addendum)
 Schedule a lab visit at the check out desk within 2 weeks- schedule 05/16/23 or later to make sure full year. Return for future fasting labs meaning nothing but water after midnight please. Ok to take your medications with water.   Great job getting back on weight loss journey! Especially through the holidays!   ONCE you get results back- ask to schedule 6 month labs plus a visit a few days later to go over labs (you can do this through mychart).

## 2023-05-07 NOTE — Progress Notes (Signed)
 Phone [phone]   Subjective:  Patient presents today for their annual physical. Chief complaint-noted.   See problem oriented charting- ROS- full  review of systems was completed and negative except for: right jaw swelling sensation still, Eczema skin changes  The following were reviewed and entered/updated in epic: Past Medical History:  Diagnosis Date   HOH (hard of hearing)    Hypertension    Pre-eclampsia    Vitamin D  deficiency 04/25/2011   26 /23.5   Patient Active Problem List   Diagnosis Date Noted   Hyperglycemia 05/04/2022    Priority: Medium    Essential hypertension 11/15/2021    Priority: Medium    Vitamin D  deficiency 11/15/2021    Priority: Medium    IUD (intrauterine device) in place 11/15/2021    Priority: Low   Hearing loss of both ears 06/25/2011    Priority: Low   Past Surgical History:  Procedure Laterality Date   CESAREAN SECTION  09/04/2003   CESAREAN SECTION  08/11/2005   Mirena      inserted 10-26-20    Family History  Problem Relation Age of Onset   Hypertension Mother    Hypertension Father    Stroke Father        age 68   Hypertension Sister    Hypertension Brother    Diabetes Brother    Other Maternal Grandmother        42 in 2023   Diabetes Maternal Aunt    Aneurysm Maternal Aunt        brain   Colon cancer Neg Hx    Colon polyps Neg Hx    Esophageal cancer Neg Hx    Stomach cancer Neg Hx    Rectal cancer Neg Hx     Medications- reviewed and updated Current Outpatient Medications  Medication Sig Dispense Refill   ibuprofen (ADVIL) 400 MG tablet TAKE 1 TABLET (400 MG TOTAL) BY MOUTH EVERY 8 (EIGHT) HOURS AS NEEDED FOR PAIN FOR UP TO 14 DAYS     levonorgestrel  (MIRENA ) 20 MCG/DAY IUD 1 each by Intrauterine route once.     losartan  (COZAAR ) 100 MG tablet Take 1 tablet (100 mg total) by mouth daily. 90 tablet 3   No current facility-administered medications for this visit.    Allergies-reviewed and updated No Known  Allergies  Social History   Social History Narrative   Married in [id number]. 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 Oropharynx normal Neck: no thyromegaly CV: RRR no murmurs rubs or gallops Lungs: CTAB no crackles, wheeze, rhonchi Abdomen: soft/nontender/nondistended/normal bowel sounds. No rebound or guarding.  Ext: no edema Skin: warm, dry Neuro: grossly normal, moves all extremities, PERRLA   Assessment and Plan   53 y.o. female presenting for annual physical.  Health Maintenance counseling: 1. Anticipatory guidance: Patient counseled regarding regular dental exams -q6 months, eye exams - yearly,  avoiding smoking and second hand smoke, limiting alcohol to 1 beverage per day- not at all , no illicit drugs .   2. Risk factor reduction:  Advised patient of need for regular exercise and diet rich and fruits and vegetables to reduce risk of heart attack and stroke.  Exercise-last year was doing yoga, Zumba, walking at proper treadmill, started body balance class at [employer]-previously [employer] found to be very helpful.  Diet/weight management-patient down 2 pounds in the last month-encouraged her to continue her progress- started with weight watchers right after Christmas but then started with optivia as has a lot left.  She had is low as 159 last year Wt Readings from Last 3 Encounters:  05/07/23 195 lb (88.5 kg)  04/09/23 197 lb (89.4 kg)  08/03/22 171 lb (77.6 kg)  3. Immunizations/screenings/ancillary studies-declines COVID, flu, Shingrix  Immunization History  Administered Date(s) Administered   PFIZER(Purple Top)SARS-COV-2 Vaccination  06/20/2019, 07/12/2019, 12/11/2020   Tdap 12/14/2011, 05/04/2022  4. Cervical cancer screening- 04/12/21- follows with Dr. Lavoie in the past-will be due for 3-year follow-up later this year - scheduled tomorrow 5. Breast cancer screening-  breast exam with GYN and mammogram 10/23/2022  6. 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 Skin cancer screening-has been following with dermatology yearly.   advised regular sunscreen use. Denies worrisome, changing, or new skin lesions- other than dry skin 8. Birth control/STD check- IUD- thinks may be close- and monogamous   9. Osteoporosis screening at 30- will plan on this 10. Smoking associated screening - never smoker  Status of chronic or acute concerns   # Dry skin on left index finger-patient reports has been like this for about a year-she has tried Vaseline and other topical products without relief. Did use on fingernail of that nail a topical over a year ago- may have contributed but not sure. Doesn't think she mentioned to dermatology - I want her to try hydrocortisone 1% OTC (available over the counter without a prescription) and cover with Vaseline twice a day for 7-10 days- if no better- will level up to triamcinolone- she can message me   #hypertension S: medication: Losartan  100 mg - With lower weights typically tolerates hydrochlorothiazide  12.5 mg but with weight increases typically has to be on losartan  100 mg BP Readings from Last 3 Encounters:  05/07/23 120/80  04/09/23 120/70  08/03/22 118/78  A/P: stable- continue current medicines    # Hyperglycemia/insulin resistance/prediabetes-peak A1c 5.8 S:  Medication: None Lab Results  Component Value Date   HGBA1C 5.8 11/13/2022   HGBA1C 5.8 05/08/2022   A/P: Hopefully stable or improved- update A1c with labs today. Continue without meds for now.   # Left shoulder pain and left hip pain after accident-continues to work with Dr. Arnaldo of  orthopedics-most recent visit 04/13/2023  # Chronic right jaw pain/swelling-intermittent issues since prior to July 2023 when I met her.  Pain portion has been better but feels swelling portion has been persistent-prior extensive workup including [facility] visits. 1 week of frankincense to see if it will help  #Vitamin D  deficiency S: Medication: 1000 units plus multivitamin A/P: hopefully stable- update vitamin D  today. Continue current meds for now    Recommended follow up: Return in about 6 months (around 11/04/2023) for followup or sooner if needed.Schedule b4 you leave. Future Appointments  Date Time Provider Department Center  05/08/2023  3:00 PM Glennon Almarie POUR, MD [facility] None   Lab/Order associations:will return  fasting   ICD-10-CM   1. Preventative health care  Z00.00     2. Screening for diabetes mellitus  Z13.1 Hemoglobin A1c  3. Obesity (BMI 30-39.9)  E66.9 Hemoglobin A1c    4. Screening for hyperlipidemia  Z13.220 Comprehensive metabolic panel    Lipid panel    5. Screening, deficiency anemia, iron  Z13.0 CBC with Differential/Platelet    6. Vitamin D  deficiency  E55.9 VITAMIN D  25 Hydroxy (Vit-D Deficiency, Fractures)     No orders of the defined types were placed in this encounter.  Return precautions advised.  Garnette Lukes, MD

## 2023-05-08 ENCOUNTER — Ambulatory Visit (INDEPENDENT_AMBULATORY_CARE_PROVIDER_SITE_OTHER): Payer: 59 | Admitting: Obstetrics and Gynecology

## 2023-05-08 ENCOUNTER — Encounter: Payer: Self-pay | Admitting: Obstetrics and Gynecology

## 2023-05-08 ENCOUNTER — Other Ambulatory Visit (HOSPITAL_COMMUNITY)
Admission: RE | Admit: 2023-05-08 | Discharge: 2023-05-08 | Disposition: A | Discharge status: Home / Self Care | Payer: 59 | Source: Ambulatory Visit | Attending: Obstetrics and Gynecology | Admitting: Obstetrics and Gynecology

## 2023-05-08 VITALS — BP 116/72 | HR 85 | Ht 61.5 in | Wt 194.0 lb

## 2023-05-08 DIAGNOSIS — Z01419 Encounter for gynecological examination (general) (routine) without abnormal findings: Secondary | ICD-10-CM | POA: Diagnosis present

## 2023-05-08 NOTE — Progress Notes (Signed)
 53 y.o. y.o. female here for annual exam. No LMP recorded. (Menstrual status: IUD).    G2P2L2 Married.  Daughter at [facility].   RP:  Established patient presenting for annual gyn exam    HPI: Well on Mirena  IUD x 10/2020.  No BTB.  No pelvic pain.  No pain with IC. Pap Neg 03/2021.  HPV HR Neg 2019.  No h/o abnormal Pap. Repeat Pap at 3 years.  Breasts normal.  Screening mammo Neg 10/23/22. Colono 11/2020.  BMI decreased to 30.17. Nutrition and fitness program.  Health labs with Fam MD. COLONOSCOPY: 12-01-20.  Pt declines flu vaccine.  Body mass index is 36.06 kg/m.     05/08/2023    3:12 PM 04/09/2023    2:25 PM 11/15/2021    1:54 PM  Depression screen PHQ 2/9  Decreased Interest 0 0 0  Down, Depressed, Hopeless 0 0 0  PHQ - 2 Score 0 0 0  Altered sleeping  0 0  Tired, decreased energy  0 0  Change in appetite  0 0  Feeling bad or failure about yourself   0 0  Trouble concentrating  0 0  Moving slowly or fidgety/restless  0 0  Suicidal thoughts  0 0  PHQ-9 Score  0 0  Difficult doing work/chores  Not difficult at all Not difficult at all    Blood pressure 116/72, pulse 85, height 5' 1.5 (1.562 m), weight 194 lb (88 kg), SpO2 99%.     Component Value Date/Time   DIAGPAP  04/12/2021 1447    - Negative for intraepithelial lesion or malignancy (NILM)   ADEQPAP  04/12/2021 1447    Satisfactory for evaluation; transformation zone component PRESENT.    GYN HISTORY:    Component Value Date/Time   DIAGPAP  04/12/2021 1447    - Negative for intraepithelial lesion or malignancy (NILM)   ADEQPAP  04/12/2021 1447    Satisfactory for evaluation; transformation zone component PRESENT.    OB History  Gravida Para Term Preterm AB Living  2 2 2   0 2  SAB IAB Ectopic Multiple Live Births    0      # Outcome Date GA Lbr Len/2nd Weight Sex Type Anes PTL Lv  2 Term           1 Term             Past Medical History:  Diagnosis Date   HOH (hard of hearing)     Hypertension    Pre-eclampsia    Vitamin D  deficiency 04/25/2011   26 /23.5    Past Surgical History:  Procedure Laterality Date   CESAREAN SECTION  09/04/2003   CESAREAN SECTION  08/11/2005   Mirena      inserted 10-26-20    Current Outpatient Medications on File Prior to Visit  Medication Sig Dispense Refill   ibuprofen (ADVIL) 400 MG tablet TAKE 1 TABLET (400 MG TOTAL) BY MOUTH EVERY 8 (EIGHT) HOURS AS NEEDED FOR PAIN FOR UP TO 14 DAYS     levonorgestrel  (MIRENA ) 20 MCG/DAY IUD 1 each by Intrauterine route once.     losartan  (COZAAR ) 100 MG tablet Take 1 tablet (100 mg total) by mouth daily. 90 tablet 3   Multiple Vitamin (MULTIVITAMIN PO) Take by mouth.     No current facility-administered medications on file prior to visit.    Social History   Socioeconomic History   Marital status: Married    Spouse name: Not on file  Number of children: 2   Years of education: college   Highest education level: Not on file  Occupational History   Not on file  Tobacco Use   Smoking status: Never   Smokeless tobacco: Never  Vaping Use   Vaping status: Never Used  Substance and Sexual Activity   Alcohol use: No    Alcohol/week: 0.0 standard drinks of alcohol   Drug use: No   Sexual activity: Yes    Partners: Male    Birth control/protection: I.U.D.    Comment: mirena  inserted 10-26-20  Other Topics Concern   Not on file  Social History Narrative   Married in [address]. 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      Hobbies: yoga, walking, cooking, time on porch and listening to birds   Social Drivers of Corporate Investment Banker Strain: Not on file  Food Insecurity: Not on file  Transportation Needs: Not on file  Physical Activity: Not on file  Stress: Not on file  Social Connections: Not on file  Intimate Partner Violence: Not on file    Family History  Problem Relation Age of Onset   Hypertension Mother     Hypertension Father    Stroke Father        age 24   Hypertension Sister    Hypertension Brother    Diabetes Brother    Other Maternal Grandmother        86 in 2023   Diabetes Maternal Aunt    Aneurysm Maternal Aunt        brain   Colon cancer Neg Hx    Colon polyps Neg Hx    Esophageal cancer Neg Hx    Stomach cancer Neg Hx    Rectal cancer Neg Hx      No Known Allergies    Patient's last menstrual period was No LMP recorded. (Menstrual status: IUD)..            Review of Systems Alls systems reviewed and are negative.     Physical Exam Constitutional:      Appearance: Normal appearance.  Genitourinary:     Vulva and urethral meatus normal.     No lesions in the vagina.     Genitourinary Comments: Could not see strings on exam on BME could feel both strings. Speculum difficult to fully open     Right Labia: No rash, lesions or skin changes.    Left Labia: No lesions, skin changes or rash.    No vaginal discharge or tenderness.     No vaginal prolapse present.    No vaginal atrophy present.     Right Adnexa: not tender, not palpable and no mass present.    Left Adnexa: not tender, not palpable and no mass present.    No cervical motion tenderness or discharge.     IUD strings visualized.     Uterus is not enlarged, tender or irregular.  Breasts:    Right: Normal.     Left: Normal.  HENT:     Head: Normocephalic.  Neck:     Thyroid: No thyroid mass, thyromegaly or thyroid tenderness.  Cardiovascular:     Rate and Rhythm: Normal rate and regular rhythm.     Heart sounds: Normal heart sounds, S1 normal and S2 normal.  Pulmonary:     Effort: Pulmonary effort is normal.     Breath sounds: Normal breath sounds and  air entry.  Abdominal:     General: There is no distension.     Palpations: Abdomen is soft. There is no mass.     Tenderness: There is no abdominal tenderness. There is no guarding or rebound.  Musculoskeletal:        General: Normal range of  motion.     Cervical back: Full passive range of motion without pain, normal range of motion and neck supple. No tenderness.     Right lower leg: No edema.     Left lower leg: No edema.  Neurological:     Mental Status: She is alert.  Skin:    General: Skin is warm.  Psychiatric:        Mood and Affect: Mood normal.        Behavior: Behavior normal.        Thought Content: Thought content normal.  Vitals and nursing note reviewed. Exam conducted with a chaperone present.       A:         Well Woman GYN exam, IUD since 2022 and pleased with it.                               P:        Pap smear collected today Encouraged annual mammogram screening Colon cancer screening up-to-date DXA not indicated Labs and immunizations to do with PMD Encouraged healthy lifestyle practices   No follow-ups on file.  Adriana Brown

## 2023-05-11 ENCOUNTER — Other Ambulatory Visit: Payer: Self-pay

## 2023-05-11 DIAGNOSIS — B379 Candidiasis, unspecified: Secondary | ICD-10-CM

## 2023-05-11 LAB — CYTOLOGY - PAP
Adequacy: ABSENT
Comment: NEGATIVE
Diagnosis: NEGATIVE
Diagnosis: REACTIVE
High risk HPV: NEGATIVE

## 2023-05-11 MED ORDER — FLUCONAZOLE 150 MG PO TABS: ORAL_TABLET | ORAL | 0 refills | Status: DC

## 2023-05-14 ENCOUNTER — Encounter: Payer: Self-pay | Admitting: Family Medicine

## 2023-05-14 ENCOUNTER — Encounter: Payer: Self-pay | Admitting: Obstetrics and Gynecology

## 2023-05-14 ENCOUNTER — Other Ambulatory Visit (INDEPENDENT_AMBULATORY_CARE_PROVIDER_SITE_OTHER): Payer: 59

## 2023-05-14 DIAGNOSIS — E669 Obesity, unspecified: Secondary | ICD-10-CM

## 2023-05-14 DIAGNOSIS — Z131 Encounter for screening for diabetes mellitus: Secondary | ICD-10-CM

## 2023-05-14 DIAGNOSIS — Z130 Encounter for screening for diseases of the blood and blood-forming organs and certain disorders involving the immune mechanism: Secondary | ICD-10-CM

## 2023-05-14 DIAGNOSIS — Z13220 Encounter for screening for lipoid disorders: Secondary | ICD-10-CM

## 2023-05-14 DIAGNOSIS — E559 Vitamin D deficiency, unspecified: Secondary | ICD-10-CM

## 2023-05-14 LAB — CBC WITH DIFFERENTIAL/PLATELET
Basophils Absolute: 0 10*3/uL (ref 0.0–0.1)
Basophils Relative: 0.4 % (ref 0.0–3.0)
Eosinophils Absolute: 0.2 10*3/uL (ref 0.0–0.7)
Eosinophils Relative: 2.2 % (ref 0.0–5.0)
HCT: 38.8 % (ref 36.0–46.0)
Hemoglobin: 12.8 g/dL (ref 12.0–15.0)
Lymphocytes Relative: 21.6 % (ref 12.0–46.0)
Lymphs Abs: 1.6 10*3/uL (ref 0.7–4.0)
MCHC: 33.1 g/dL (ref 30.0–36.0)
MCV: 92.7 fL (ref 78.0–100.0)
Monocytes Absolute: 0.5 10*3/uL (ref 0.1–1.0)
Monocytes Relative: 7.1 % (ref 3.0–12.0)
Neutro Abs: 5.1 10*3/uL (ref 1.4–7.7)
Neutrophils Relative %: 68.7 % (ref 43.0–77.0)
Platelets: 268 10*3/uL (ref 150.0–400.0)
RBC: 4.19 Mil/uL (ref 3.87–5.11)
RDW: 13.5 % (ref 11.5–15.5)
WBC: 7.5 10*3/uL (ref 4.0–10.5)

## 2023-05-14 LAB — COMPREHENSIVE METABOLIC PANEL
ALT: 13 U/L (ref 0–35)
AST: 15 U/L (ref 0–37)
Albumin: 4.6 g/dL (ref 3.5–5.2)
Alkaline Phosphatase: 68 U/L (ref 39–117)
BUN: 20 mg/dL (ref 6–23)
CO2: 27 meq/L (ref 19–32)
Calcium: 9.8 mg/dL (ref 8.4–10.5)
Chloride: 104 meq/L (ref 96–112)
Creatinine, Ser: 0.93 mg/dL (ref 0.40–1.20)
GFR: 71.52 mL/min (ref >60.00)
Glucose, Bld: 94 mg/dL (ref 70–99)
Potassium: 4.3 meq/L (ref 3.5–5.1)
Sodium: 139 meq/L (ref 135–145)
Total Bilirubin: 0.6 mg/dL (ref 0.2–1.2)
Total Protein: 7.6 g/dL (ref 6.0–8.3)

## 2023-05-14 LAB — LIPID PANEL
Cholesterol: 188 mg/dL (ref 0–200)
HDL: 60.9 mg/dL (ref >39.00)
LDL Cholesterol: 112 mg/dL — ABNORMAL HIGH (ref 0–99)
NonHDL: 126.87
Total CHOL/HDL Ratio: 3
Triglycerides: 72 mg/dL (ref 0.0–149.0)
VLDL: 14.4 mg/dL (ref 0.0–40.0)

## 2023-05-14 LAB — HEMOGLOBIN A1C: Hgb A1c MFr Bld: 5.6 % (ref 4.6–6.5)

## 2023-05-14 LAB — VITAMIN D 25 HYDROXY (VIT D DEFICIENCY, FRACTURES): VITD: 36.31 ng/mL (ref 30.00–100.00)

## 2023-10-24 LAB — HM MAMMOGRAPHY

## 2023-11-12 ENCOUNTER — Ambulatory Visit: Payer: 59 | Admitting: Family Medicine

## 2023-11-14 ENCOUNTER — Encounter: Payer: Self-pay | Admitting: Family Medicine

## 2023-11-26 ENCOUNTER — Encounter: Payer: Self-pay | Admitting: Family Medicine

## 2023-11-26 ENCOUNTER — Ambulatory Visit: Payer: Self-pay | Admitting: Family Medicine

## 2023-11-26 ENCOUNTER — Ambulatory Visit: Admitting: Family Medicine

## 2023-11-26 VITALS — BP 112/70 | HR 100 | Temp 97.5°F | Ht 61.5 in | Wt 199.6 lb

## 2023-11-26 DIAGNOSIS — R110 Nausea: Secondary | ICD-10-CM

## 2023-11-26 DIAGNOSIS — R739 Hyperglycemia, unspecified: Secondary | ICD-10-CM | POA: Diagnosis not present

## 2023-11-26 DIAGNOSIS — I10 Essential (primary) hypertension: Secondary | ICD-10-CM | POA: Diagnosis not present

## 2023-11-26 DIAGNOSIS — Z131 Encounter for screening for diabetes mellitus: Secondary | ICD-10-CM

## 2023-11-26 DIAGNOSIS — R7303 Prediabetes: Secondary | ICD-10-CM | POA: Diagnosis not present

## 2023-11-26 LAB — CBC WITH DIFFERENTIAL/PLATELET
Basophils Absolute: 0 K/uL (ref 0.0–0.1)
Basophils Relative: 0.3 % (ref 0.0–3.0)
Eosinophils Absolute: 0.5 K/uL (ref 0.0–0.7)
Eosinophils Relative: 6.7 % — ABNORMAL HIGH (ref 0.0–5.0)
HCT: 38.9 % (ref 36.0–46.0)
Hemoglobin: 12.6 g/dL (ref 12.0–15.0)
Lymphocytes Relative: 20 % (ref 12.0–46.0)
Lymphs Abs: 1.6 K/uL (ref 0.7–4.0)
MCHC: 32.3 g/dL (ref 30.0–36.0)
MCV: 90.3 fl (ref 78.0–100.0)
Monocytes Absolute: 0.5 K/uL (ref 0.1–1.0)
Monocytes Relative: 6.7 % (ref 3.0–12.0)
Neutro Abs: 5.2 K/uL (ref 1.4–7.7)
Neutrophils Relative %: 66.3 % (ref 43.0–77.0)
Platelets: 267 K/uL (ref 150.0–400.0)
RBC: 4.31 Mil/uL (ref 3.87–5.11)
RDW: 13.6 % (ref 11.5–15.5)
WBC: 7.9 K/uL (ref 4.0–10.5)

## 2023-11-26 LAB — COMPREHENSIVE METABOLIC PANEL WITH GFR
ALT: 12 U/L (ref 0–35)
AST: 14 U/L (ref 0–37)
Albumin: 4.6 g/dL (ref 3.5–5.2)
Alkaline Phosphatase: 77 U/L (ref 39–117)
BUN: 14 mg/dL (ref 6–23)
CO2: 29 meq/L (ref 19–32)
Calcium: 9.9 mg/dL (ref 8.4–10.5)
Chloride: 102 meq/L (ref 96–112)
Creatinine, Ser: 0.84 mg/dL (ref 0.40–1.20)
GFR: 80.51 mL/min (ref >60.00)
Glucose, Bld: 90 mg/dL (ref 70–99)
Potassium: 4.3 meq/L (ref 3.5–5.1)
Sodium: 140 meq/L (ref 135–145)
Total Bilirubin: 0.4 mg/dL (ref 0.2–1.2)
Total Protein: 7.2 g/dL (ref 6.0–8.3)

## 2023-11-26 LAB — LIPASE: Lipase: 10 U/L — ABNORMAL LOW (ref 11.0–59.0)

## 2023-11-26 LAB — HEMOGLOBIN A1C: Hgb A1c MFr Bld: 5.8 % (ref 4.6–6.5)

## 2023-11-26 NOTE — Progress Notes (Signed)
 Phone [phone] In person visit   Subjective:   Adriana Brown is a 53 y.o. year old very pleasant female patient who presents for/with See problem oriented charting Chief Complaint  Patient presents with   Medical Management of Chronic Issues   Hypertension   abcess    Pt c/o abscess in left jaw.   Nausea    Pt c/o sporatic nausea spells that started in June    Past Medical History-  Patient Active Problem List   Diagnosis Date Noted   Hyperglycemia 05/04/2022    Priority: Medium    Essential hypertension 11/15/2021    Priority: Medium    Vitamin D  deficiency 11/15/2021    Priority: Medium    IUD (intrauterine device) in place 11/15/2021    Priority: Low   Hearing loss of both ears 06/25/2011    Priority: Low    Medications- reviewed and updated Current Outpatient Medications  Medication Sig Dispense Refill   ibuprofen (ADVIL) 400 MG tablet TAKE 1 TABLET (400 MG TOTAL) BY MOUTH EVERY 8 (EIGHT) HOURS AS NEEDED FOR PAIN FOR UP TO 14 DAYS     levonorgestrel  (MIRENA ) 20 MCG/DAY IUD 1 each by Intrauterine route once.     losartan  (COZAAR ) 100 MG tablet Take 1 tablet (100 mg total) by mouth daily. 90 tablet 3   Multiple Vitamin (MULTIVITAMIN PO) Take by mouth.     fluconazole  (DIFLUCAN ) 150 MG tablet Take one tab as one dose then repeat in 48hours. 2 tablet 0   No current facility-administered medications for this visit.     Objective:  BP 112/70   Pulse 100   Temp (!) 97.5 F (36.4 C)   Ht 5' 1.5 (1.562 m)   Wt 199 lb 9.6 oz (90.5 kg)   SpO2 97%   BMI 37.10 kg/m  Gen: NAD, resting comfortably Left oral lesion- has small whitish discoloration at tip - looks like may have been bitten with minimal surrounding erythema. Able to palpate without induration CV: RRR no murmurs rubs or gallops Lungs: CTAB no crackles, wheeze, rhonchi Abdomen: soft/nontender/nondistended/normal bowel sounds. No rebound or guarding.  Ext: no edema Skin: warm, dry Tender to  palpation at insertion of plantar fascia on the right    Assessment and Plan   # Left oral lesion- bump she noted yesterday morning inside of mouth. Mild tenderness dull ache. No fever or chills. No redness on outside.  -on exam almost looks like may have been bitten overnight- could also be canker sore related- I think should improve with time but if worsens should see us  back or dentist- no sign of infection at present -right jaw continues to bother her but is stable  #stubbed left 4th toe in [place]- offered x-ray - wants to hold off and plans to buddy tape 6 weeks and follow up if not improving. Reports had left pinky toe with similar that took 1.5 months to heal  # Sporadic nausea S: Reports issues started in June- about 3x- twice first 2 weeks of June (when getting out of school). Would get up fine but would hit her suddenly- like a hunger but then was more nausea. Would eat as felt like hunger but didn't help at all. Did have vomiting and diarrhea with 1st 2 episodes- 3rd one in July was the worst- was going to [employer] to eat- while waiting symptoms worsened- then worsened again after eating and had emesis.  No trouble swallowing.   No constipation. - essential oils digestion was helpful -  rubs on abdomen and smells it A/P: unclear what these episodes were or if they were even related- possibly food irritant. Glad essential oils helped. Will check labs including lipase today to be cautious. If having more frequent episodes would want to recheck.   #Right plantar fasciitis- has had issues before but was more midfoot- this is more at insertion point- gave handout for home exercises and encouraged more supportive shoes and inserts which helped her more in the past  #hypertension S: medication: Losartan  100 mg Home readings #s: rare checks BP Readings from Last 3 Encounters:  11/26/23 112/70  05/08/23 116/72  05/07/23 120/80  A/P: well controlled continue current medications    #hyperlipidemia mild S: Medication:none The 10-year ASCVD risk score (Arnett DK, et al., 2019) is: 1.8% Lab Results  Component Value Date   CHOL 188 05/14/2023   HDL 60.90 05/14/2023   LDLCALC 112 (H) 05/14/2023   TRIG 72.0 05/14/2023   CHOLHDL 3 05/14/2023  A/P: lipids mildly high but not in range for medicine at this point- keep working on healthy eating and regular exercise    # Hyperglycemia/insulin resistance/prediabetes-peak A1c 5.8 S:  Medication: None -up 4 lbs- enjoying summer vacation but plans to reverse this Lab Results  Component Value Date   HGBA1C 5.6 05/14/2023   HGBA1C 5.8 11/13/2022   HGBA1C 5.8 05/08/2022  A/P: hopefully stable- update a1c today. Continue without meds for now . 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 Future Appointments  Date Time Provider Department Center  05/08/2024  3:00 PM Glennon Almarie POUR, MD [facility] None  05/12/2024  9:20 AM Katrinka Garnette KIDD, MD [facility]    Lab/Order associations:   ICD-10-CM   1. Essential hypertension  I10 Comprehensive metabolic panel with GFR    CBC with Differential/Platelet    2. Hyperglycemia  R73.9 Hemoglobin A1c    3. Prediabetes  R73.03 Hemoglobin A1c    4. Nausea  R11.0 Lipase    5. Screening for diabetes mellitus  Z13.1 Hemoglobin A1c      No orders of the defined types were placed in this encounter.   Return precautions advised.  Garnette Katrinka, MD

## 2023-11-26 NOTE — Patient Instructions (Addendum)
 unclear what these episodes were or if they were even related- possibly food irritant. Glad essential oils helped. Will check labs including lipase today to be cautious. If having more frequent episodes would want to recheck.   Left oral lesion- -on exam almost looks like may have been bitten overnight- could also be canker sore related- I think should improve with time but if worsens should see us  back or dentist- no sign of infection at present  Team please give him exercises for plantar fasciitis  I want you to do the exercise 3x a week for a month then once a week for another month. Stop any exercise that causes more than 1-2/10 pain increase. If not doing better within 1-2 months let us  refer you to sports medicine or podiatry  #Right plantar fasciitis- has had issues before but was more midfoot- this is more at insertion point- gave handout for home exercises and encouraged more supportive shoes and inserts which helped her more in the past  Please stop by lab before you go If you have mychart- we will send your results within 3 business days of us  receiving them.  If you do not have mychart- we will call you about results within 5 business days of us  receiving them.  *please also note that you will see labs on mychart as soon as they post. I will later go in and write notes on them- will say notes from Dr. Katrinka   Recommended follow up: Return for next already scheduled visit or sooner if needed. Or could reschedule to February 4th so we can redo a1c and know covered.

## 2023-11-28 MED ORDER — ONDANSETRON 4 MG PO TBDP: 4.0000 mg | ORAL_TABLET | Freq: Three times a day (TID) | ORAL | 0 refills | Status: DC | PRN

## 2023-12-23 ENCOUNTER — Other Ambulatory Visit: Payer: Self-pay | Admitting: Family Medicine

## 2024-03-07 ENCOUNTER — Ambulatory Visit: Admitting: Family Medicine

## 2024-03-07 ENCOUNTER — Encounter: Payer: Self-pay | Admitting: Family Medicine

## 2024-03-07 VITALS — BP 118/78 | HR 104 | Temp 98.5°F | Resp 16 | Ht 61.5 in | Wt 205.8 lb

## 2024-03-07 DIAGNOSIS — R059 Cough, unspecified: Secondary | ICD-10-CM | POA: Diagnosis not present

## 2024-03-07 DIAGNOSIS — J3089 Other allergic rhinitis: Secondary | ICD-10-CM | POA: Diagnosis not present

## 2024-03-07 DIAGNOSIS — I10 Essential (primary) hypertension: Secondary | ICD-10-CM | POA: Diagnosis not present

## 2024-03-07 MED ORDER — PREDNISONE 20 MG PO TABS: 20.0000 mg | ORAL_TABLET | Freq: Every day | ORAL | 0 refills | Status: AC

## 2024-03-07 MED ORDER — FLUTICASONE PROPIONATE 50 MCG/ACT NA SUSP: 1.0000 | Freq: Two times a day (BID) | NASAL | 0 refills | Status: DC

## 2024-03-07 MED ORDER — BENZONATATE 100 MG PO CAPS: 100.0000 mg | ORAL_CAPSULE | Freq: Two times a day (BID) | ORAL | 0 refills | Status: AC | PRN

## 2024-03-07 NOTE — Patient Instructions (Addendum)
 A few things to remember from today's visit:  Allergic rhinitis due to other allergic trigger, unspecified seasonality - Plan: fluticasone  (FLONASE ) 50 MCG/ACT nasal spray, predniSONE (DELTASONE) 20 MG tablet  Cough, unspecified type - Plan: benzonatate (TESSALON) 100 MG capsule  Essential hypertension  Avoid cold meds, could increase blood pressure. Flonase  nasal spray daily at bedtime for 10-14 days then as needed. Prednisone with breakfast. Monitor for fever. Follow with Dr Katrinka if symptoms are not resolved in 10-14 days.  If you need refills for medications you take chronically, please call your pharmacy. Do not use My Chart to request refills or for acute issues that need immediate attention. If you send a my chart message, it may take a few days to be addressed, specially if I am not in the office.  Please be sure medication list is accurate. If a new problem present, please set up appointment sooner than planned today.

## 2024-03-07 NOTE — Progress Notes (Signed)
 ACUTE VISIT Chief Complaint  Patient presents with   Cough    Pt c/o cough and congestion off and on for 6wks. Denied fever, bodyache, fatigue, bodyache. Tried mucinex, day quil, theraflu. Essential oil. Not working. Has clear- yellow phlegm.    Nasal Congestion    Discussed the use of AI scribe software for clinical note transcription with the patient, who gave verbal consent to proceed.  History of Present Illness Adriana Brown is a 53 year old female with past medical history significant for hypertension, hearing loss, and vitamin D  deficiency who presents with a six-week history of cough and congestion as described above.  She has experienced intermittent cough and nasal congestion for the past six weeks. Initially, symptoms lasted for about two weeks, improved, but then flared up after exposure to cold air at a hockey game. This pattern repeated after exposure to a fan at work, leading to a sore throat and congestion.  No fever, body aches, chills, dyspnea,or wheezing.  No ear discomfort.  She has tried Mucinex, DayQuil, and Teraflu in the past but is not currently taking any medications for her symptoms.  She states that an allergist previously identified multiple allergies, but she has not taken allergy medications as she usually does not have issues with allergies.  No recent travel or exposure to sick individuals at the onset of her symptoms. She has not been diagnosed with asthma and does not smoke.  Review of Systems  Constitutional:  Negative for activity change, appetite change, chills and fever.  HENT:  Positive for congestion. Negative for ear discharge, ear pain, facial swelling, mouth sores and trouble swallowing.   Eyes:  Negative for discharge and redness.  Respiratory:  Negative for stridor.   Cardiovascular:  Negative for chest pain and leg swelling.  Gastrointestinal:  Negative for abdominal pain, nausea and vomiting.  Skin:  Negative for rash.   Allergic/Immunologic: Positive for environmental allergies.  Neurological:  Negative for syncope, facial asymmetry, weakness and headaches.  See other pertinent positives and negatives in HPI.  Current Outpatient Medications on File Prior to Visit  Medication Sig Dispense Refill   ibuprofen (ADVIL) 400 MG tablet TAKE 1 TABLET (400 MG TOTAL) BY MOUTH EVERY 8 (EIGHT) HOURS AS NEEDED FOR PAIN FOR UP TO 14 DAYS     levonorgestrel  (MIRENA ) 20 MCG/DAY IUD 1 each by Intrauterine route once.     losartan  (COZAAR ) 100 MG tablet Take 1 tablet (100 mg total) by mouth daily. 90 tablet 3   Multiple Vitamin (MULTIVITAMIN PO) Take by mouth.     fluconazole  (DIFLUCAN ) 150 MG tablet Take one tab as one dose then repeat in 48hours. (Patient not taking: Reported on 03/07/2024) 2 tablet 0   ondansetron  (ZOFRAN -ODT) 4 MG disintegrating tablet TAKE 1 TABLET BY MOUTH EVERY 8 HOURS AS NEEDED FOR NAUSEA AND VOMITING (Patient not taking: Reported on 03/07/2024) 18 tablet 1   No current facility-administered medications on file prior to visit.   Past Medical History:  Diagnosis Date   HOH (hard of hearing)    Hypertension    Pre-eclampsia    Vitamin D  deficiency 04/25/2011   26 /23.5   No Known Allergies  Social History   Socioeconomic History   Marital status: Married    Spouse name: Not on file   Number of children: 2   Years of education: college   Highest education level: Not on file  Occupational History   Not on file  Tobacco Use  Smoking status: Never   Smokeless tobacco: Never  Vaping Use   Vaping status: Never Used  Substance and Sexual Activity   Alcohol use: No    Alcohol/week: 0.0 standard drinks of alcohol   Drug use: No   Sexual activity: Yes    Partners: Male    Birth control/protection: I.U.D.    Comment: mirena  inserted 10-26-20  Other Topics Concern   Not on file  Social History Narrative   Married in 1996. 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  Physical Exam Vitals and nursing note reviewed.  Constitutional:      General: She is not in acute distress.    Appearance: She is well-developed. She is not ill-appearing.  HENT:     Head: Normocephalic and atraumatic.     Nose: Congestion and rhinorrhea present.     Right Turbinates: Enlarged.     Left Turbinates: Enlarged.     Right Sinus: No maxillary sinus tenderness or frontal sinus tenderness.     Left Sinus: No maxillary sinus tenderness or frontal sinus tenderness.     Mouth/Throat:     Mouth: Mucous membranes are moist.     Pharynx: Oropharynx is clear. Postnasal drip present.  Eyes:     Conjunctiva/sclera: Conjunctivae normal.  Cardiovascular:     Rate and Rhythm: Regular rhythm. Tachycardia present.     Heart sounds: No murmur Brown. Pulmonary:     Effort: Pulmonary effort is normal. No respiratory distress.     Breath sounds: Normal breath sounds. No stridor.     Comments: Nonproductive cough a few times during visit. Lymphadenopathy:     Head:     Right side of head: No submandibular adenopathy.     Left side of head: No submandibular adenopathy.     Cervical: No cervical adenopathy.  Skin:    General: Skin is warm.     Findings: No erythema or rash.  Neurological:     Mental Status: She is alert and oriented to person, place, and time.  Psychiatric:        Mood and Affect: Mood and affect normal.    ASSESSMENT AND PLAN:  Adriana Brown was seen today for  nasal congestion and cough.  Allergic rhinitis due to other allergic trigger, unspecified seasonality This problem could be contributing to most of her symptoms. Recommend Flonase  nasal spray daily at bedtime for 10 to 14 days then as needed. Nasal saline irrigations during the day as needed. Short course of prednisone may also help, recommend 40 mg daily with breakfast for 3 days. OTC antihistaminic like Zyrtec 10 mg daily.We discussed some side effects.  -     Fluticasone  Propionate; Place 1 spray into both nostrils 2 (two) times daily.  Dispense: 16 g; Refill: 0 -     predniSONE; Take 1 tablet (20  mg total) by mouth daily with breakfast for 3 days.  Dispense: 3 tablet; Refill: 0  Cough, unspecified type Lung auscultation normal. History and examination today do not suggest a serious process. We decided to hold on chest x-ray for now. Benzonatate recommended for symptomatic treatment. Avoid OTC cold medications due to risk of elevated BP.  -     Benzonatate; Take 1 capsule (100 mg total) by mouth 2 (two) times daily as needed for up to 7 days.  Dispense: 14 capsule; Refill: 0  Essential hypertension Initially elevated, improved after a few minutes. Mildly tachycardic, could be caused by coughing during examination. Currently on losartan  100 mg daily. Continue follow-up with PCP.  Return in about 2 weeks (around 03/21/2024), or if symptoms worsen or fail to improve.  Jackee Glasner G. Nash Bolls, MD  [facility]. [place] office.

## 2024-03-11 ENCOUNTER — Encounter: Payer: Self-pay | Admitting: Family Medicine

## 2024-03-18 ENCOUNTER — Encounter: Payer: Self-pay | Admitting: Family Medicine

## 2024-03-18 MED ORDER — AMOXICILLIN-POT CLAVULANATE 875-125 MG PO TABS: 1.0000 | ORAL_TABLET | Freq: Two times a day (BID) | ORAL | 0 refills | Status: AC

## 2024-03-31 ENCOUNTER — Ambulatory Visit: Admitting: Obstetrics and Gynecology

## 2024-03-31 ENCOUNTER — Other Ambulatory Visit: Payer: Self-pay

## 2024-03-31 ENCOUNTER — Telehealth: Payer: Self-pay | Admitting: *Deleted

## 2024-03-31 DIAGNOSIS — J3089 Other allergic rhinitis: Secondary | ICD-10-CM

## 2024-03-31 DIAGNOSIS — B379 Candidiasis, unspecified: Secondary | ICD-10-CM

## 2024-03-31 MED ORDER — FLUTICASONE PROPIONATE 50 MCG/ACT NA SUSP: 1.0000 | Freq: Two times a day (BID) | NASAL | 0 refills | Status: DC

## 2024-03-31 MED ORDER — FLUCONAZOLE 150 MG PO TABS: ORAL_TABLET | ORAL | 0 refills | Status: DC

## 2024-03-31 NOTE — Telephone Encounter (Signed)
 Patient scheduled by front office for rash and itching.   Call placed to patient. Patient states sheas was on Augmentin  for sinus infection, completed Abx last Tuesday. Patient reports external vaginal itching, burning sensation and redness that started on Thursday.   Denies urinary symptoms, vaginal bleeding, odor or d/c.   Advised I will review with Dr. Glennon and f/u if this can be treated or keep OV as scheduled.   Confirmed pharmacy on file.   Patient is going onto another appt, request detailed message if no answer. Is ok with cancelling OV if not necessary.    Dr. Glennon -please review.

## 2024-03-31 NOTE — Telephone Encounter (Signed)
 Patient notified. Rx sent. Patient aware f symptoms do not completely resolve, OV needed for evaluation.   Patient verbalizes understanding.   OV cancelled.   Encounter closed.

## 2024-04-02 ENCOUNTER — Other Ambulatory Visit: Payer: Self-pay

## 2024-04-02 DIAGNOSIS — J3089 Other allergic rhinitis: Secondary | ICD-10-CM

## 2024-04-02 MED ORDER — FLUTICASONE PROPIONATE 50 MCG/ACT NA SUSP: 1.0000 | Freq: Two times a day (BID) | NASAL | 0 refills | Status: AC

## 2024-04-09 ENCOUNTER — Other Ambulatory Visit: Payer: Self-pay | Admitting: Family Medicine

## 2024-05-08 ENCOUNTER — Ambulatory Visit: Payer: Self-pay | Admitting: Obstetrics and Gynecology

## 2024-05-08 ENCOUNTER — Ambulatory Visit: Payer: 59 | Admitting: Obstetrics and Gynecology

## 2024-05-08 ENCOUNTER — Encounter: Payer: Self-pay | Admitting: Obstetrics and Gynecology

## 2024-05-08 VITALS — BP 118/80 | HR 85 | Ht 62.0 in | Wt 201.0 lb

## 2024-05-08 DIAGNOSIS — N941 Unspecified dyspareunia: Secondary | ICD-10-CM

## 2024-05-08 DIAGNOSIS — N898 Other specified noninflammatory disorders of vagina: Secondary | ICD-10-CM

## 2024-05-08 DIAGNOSIS — Z01419 Encounter for gynecological examination (general) (routine) without abnormal findings: Secondary | ICD-10-CM

## 2024-05-08 DIAGNOSIS — Z1331 Encounter for screening for depression: Secondary | ICD-10-CM | POA: Diagnosis not present

## 2024-05-08 LAB — WET PREP FOR TRICH, YEAST, CLUE

## 2024-05-08 MED ORDER — INTRAROSA 6.5 MG VA INST: 1.0000 | VAGINAL_INSERT | Freq: Every evening | VAGINAL | 12 refills | Status: AC | PRN

## 2024-05-08 NOTE — Progress Notes (Signed)
 "   54 y.o. y.o. female here for annual exam. No LMP recorded. (Menstrual status: IUD).     G2P2L2 Married for 28 years.  Daughter at [facility].   RP:  Established patient presenting for annual gyn exam    HPI: Well on Mirena  IUD x 10/2020.  No BTB.  No pelvic pain.  No pain with IC. Pap Neg 2025.   No h/o abnormal Pap. Repeat Pap at 3 years.  Breasts normal.  Screening mammo 2025 [place] 11/2020.  BMI decreased to 30.17 last visit. Nutrition and fitness program.  Health labs with Fam MD. COLONOSCOPY: 12-01-20.  Starting using OTC lubricant at [facility] for dyspareunia, vaginal dryness  Body mass index is 36.76 kg/m.     05/08/2023    3:12 PM 04/09/2023    2:25 PM 11/15/2021    1:54 PM  Depression screen PHQ 2/9  Decreased Interest 0 0 0  Down, Depressed, Hopeless 0 0 0  PHQ - 2 Score 0 0 0  Altered sleeping  0 0  Tired, decreased energy  0 0  Change in appetite  0 0  Feeling bad or failure about yourself   0 0  Trouble concentrating  0 0  Moving slowly or fidgety/restless  0 0  Suicidal thoughts  0 0  PHQ-9 Score  0  0   Difficult doing work/chores  Not difficult at all Not difficult at all     Data saved with a previous flowsheet row definition    Blood pressure 118/80, pulse 85, height 5' 2 (1.575 m), weight 201 lb (91.2 kg), SpO2 99%.     Component Value Date/Time   DIAGPAP  05/08/2023 1541    - Negative for Intraepithelial Lesions or Malignancy (NILM)   DIAGPAP - Benign reactive/reparative changes 05/08/2023 1541   DIAGPAP  04/12/2021 1447    - Negative for intraepithelial lesion or malignancy (NILM)   HPVHIGH Negative 05/08/2023 1541   ADEQPAP  05/08/2023 1541    Satisfactory for evaluation; transformation zone component ABSENT.   ADEQPAP  04/12/2021 1447    Satisfactory for evaluation; transformation zone component PRESENT.    GYN HISTORY:    Component Value Date/Time   DIAGPAP  05/08/2023 1541    - Negative for Intraepithelial Lesions or Malignancy  (NILM)   DIAGPAP - Benign reactive/reparative changes 05/08/2023 1541   DIAGPAP  04/12/2021 1447    - Negative for intraepithelial lesion or malignancy (NILM)   HPVHIGH Negative 05/08/2023 1541   ADEQPAP  05/08/2023 1541    Satisfactory for evaluation; transformation zone component ABSENT.   ADEQPAP  04/12/2021 1447    Satisfactory for evaluation; transformation zone component PRESENT.    OB History  Gravida Para Term Preterm AB Living  2 2 2   0 2  SAB IAB Ectopic Multiple Live Births    0      # Outcome Date GA Lbr Len/2nd Weight Sex Type Anes PTL Lv  2 Term           1 Term             Past Medical History:  Diagnosis Date   HOH (hard of hearing)    Hypertension    Pre-eclampsia    Vitamin D  deficiency 04/25/2011   26 /23.5    Past Surgical History:  Procedure Laterality Date   CESAREAN SECTION  09/04/2003   CESAREAN SECTION  08/11/2005   Mirena      inserted 10-26-20    Medications Ordered Prior  to Encounter[1]  Social History   Socioeconomic History   Marital status: Married    Spouse name: Not on file   Number of children: 2   Years of education: college   Highest education level: Not on file  Occupational History   Not on file  Tobacco Use   Smoking status: Never   Smokeless tobacco: Never  Vaping Use   Vaping status: Never Used  Substance and Sexual Activity   Alcohol use: No    Alcohol/week: 0.0 standard drinks of alcohol   Drug use: No   Sexual activity: Yes    Partners: Male    Birth control/protection: I.U.D.    Comment: mirena  inserted 10-26-20  Other Topics Concern   Not on file  Social History Narrative   Married in [id number]. 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      Hobbies: yoga, walking, cooking, time on porch and listening to birds   Social Drivers of Health   Tobacco Use: Low Risk (05/08/2024)   Patient History    Smoking Tobacco Use: Never    Smokeless Tobacco Use: Never     Passive Exposure: Not on file  Financial Resource Strain: Not on file  Food Insecurity: Not on file  Transportation Needs: Not on file  Physical Activity: Not on file  Stress: Not on file  Social Connections: Not on file  Intimate Partner Violence: Not on file  Depression (PHQ2-9): Low Risk (05/08/2023)   Depression (PHQ2-9)    PHQ-2 Score: 0  Alcohol Screen: Not on file  Housing: Not on file  Utilities: Not on file  Health Literacy: Not on file    Family History  Problem Relation Age of Onset   Hypertension Mother    Hypertension Father    Stroke Father        age 69   Hypertension Sister    Hypertension Brother    Diabetes Brother    Other Maternal Grandmother        53 in 2023   Diabetes Maternal Aunt    Aneurysm Maternal Aunt        brain   Colon cancer Neg Hx    Colon polyps Neg Hx    Esophageal cancer Neg Hx    Stomach cancer Neg Hx    Rectal cancer Neg Hx      Allergies[2]    Patient's last menstrual period was No LMP recorded. (Menstrual status: IUD)..           Review of Systems Alls systems reviewed and are negative.     Physical Exam Constitutional:      Appearance: Normal appearance.  Genitourinary:     Vulva and urethral meatus normal.     No lesions in the vagina.     Right Labia: No rash, lesions or skin changes.    Left Labia: No lesions, skin changes or rash.    Vaginal discharge present.     No vaginal tenderness.     No vaginal prolapse present.    No vaginal atrophy present.     Right Adnexa: not tender, not palpable and no mass present.    Left Adnexa: not tender, not palpable and no mass present.    No cervical motion tenderness or discharge.     IUD strings visualized.     Uterus is not enlarged, tender or irregular.  Breasts:    Right: Normal.  Left: Normal.  HENT:     Head: Normocephalic.  Neck:     Thyroid: No thyroid mass, thyromegaly or thyroid tenderness.  Cardiovascular:     Rate and Rhythm: Normal rate and  regular rhythm.     Heart sounds: Normal heart sounds, S1 normal and S2 normal.  Pulmonary:     Effort: Pulmonary effort is normal.     Breath sounds: Normal breath sounds and air entry.  Abdominal:     General: There is no distension.     Palpations: Abdomen is soft. There is no mass.     Tenderness: There is no abdominal tenderness. There is no guarding or rebound.  Musculoskeletal:        General: Normal range of motion.     Cervical back: Full passive range of motion without pain, normal range of motion and neck supple. No tenderness.     Right lower leg: No edema.     Left lower leg: No edema.  Neurological:     Mental Status: She is alert.  Skin:    General: Skin is warm.  Psychiatric:        Mood and Affect: Mood normal.        Behavior: Behavior normal.        Thought Content: Thought content normal.  Vitals and nursing note reviewed. Exam conducted with a chaperone present.    Possible BV nuswab sent   A:         Well Woman GYN exam Dyspareunia Possible BV infection Mirena  IUD(2022)                             P:        Pap smear not indicated Encouraged annual mammogram screening Colon cancer screening up-to-date DXA not indicated Labs and immunizations to do with PMD Discussed breast self exams Encouraged healthy lifestyle practices Encouraged Vit D and Calcium  Nuswab sent To begin intrarosa  for dyspareunia. Astroglyde if extra lubrication is needed  No follow-ups on file.  Adriana Brown     [1]  Current Outpatient Medications on File Prior to Visit  Medication Sig Dispense Refill   fluticasone  (FLONASE ) 50 MCG/ACT nasal spray Place 1 spray into both nostrils 2 (two) times daily. 1 g 0   ibuprofen (ADVIL) 400 MG tablet TAKE 1 TABLET (400 MG TOTAL) BY MOUTH EVERY 8 (EIGHT) HOURS AS NEEDED FOR PAIN FOR UP TO 14 DAYS     levonorgestrel  (MIRENA ) 20 MCG/DAY IUD 1 each by Intrauterine route once.     losartan  (COZAAR ) 100 MG tablet TAKE 1 TABLET BY  MOUTH EVERY DAY 90 tablet 3   Multiple Vitamin (MULTIVITAMIN PO) Take by mouth.     ondansetron  (ZOFRAN -ODT) 4 MG disintegrating tablet TAKE 1 TABLET BY MOUTH EVERY 8 HOURS AS NEEDED FOR NAUSEA AND VOMITING 18 tablet 1   No current facility-administered medications on file prior to visit.  [2] No Known Allergies  "

## 2024-05-08 NOTE — Progress Notes (Signed)
 Vaginal dryness

## 2024-05-09 MED ORDER — FLUCONAZOLE 150 MG PO TABS: 150.0000 mg | ORAL_TABLET | Freq: Once | ORAL | 1 refills | Status: AC

## 2024-05-09 MED ORDER — METRONIDAZOLE 500 MG PO TABS: 500.0000 mg | ORAL_TABLET | Freq: Two times a day (BID) | ORAL | 0 refills | Status: AC

## 2024-05-12 ENCOUNTER — Ambulatory Visit: Payer: Self-pay | Admitting: Family Medicine

## 2024-05-12 ENCOUNTER — Ambulatory Visit (INDEPENDENT_AMBULATORY_CARE_PROVIDER_SITE_OTHER): Payer: 59 | Admitting: Family Medicine

## 2024-05-12 ENCOUNTER — Encounter: Payer: Self-pay | Admitting: Family Medicine

## 2024-05-12 VITALS — BP 124/72 | HR 98 | Temp 98.0°F | Ht 62.0 in | Wt 198.4 lb

## 2024-05-12 DIAGNOSIS — I10 Essential (primary) hypertension: Secondary | ICD-10-CM

## 2024-05-12 DIAGNOSIS — Z0000 Encounter for general adult medical examination without abnormal findings: Secondary | ICD-10-CM | POA: Diagnosis not present

## 2024-05-12 DIAGNOSIS — E559 Vitamin D deficiency, unspecified: Secondary | ICD-10-CM

## 2024-05-12 DIAGNOSIS — Z131 Encounter for screening for diabetes mellitus: Secondary | ICD-10-CM | POA: Diagnosis not present

## 2024-05-12 DIAGNOSIS — Z13220 Encounter for screening for lipoid disorders: Secondary | ICD-10-CM

## 2024-05-12 DIAGNOSIS — E669 Obesity, unspecified: Secondary | ICD-10-CM

## 2024-05-12 DIAGNOSIS — Z130 Encounter for screening for diseases of the blood and blood-forming organs and certain disorders involving the immune mechanism: Secondary | ICD-10-CM

## 2024-05-12 LAB — LIPID PANEL
Cholesterol: 137 mg/dL (ref 28–200)
HDL: 55.3 mg/dL
LDL Cholesterol: 75 mg/dL (ref 10–99)
NonHDL: 82.14
Total CHOL/HDL Ratio: 2
Triglycerides: 36 mg/dL (ref 10.0–149.0)
VLDL: 7.2 mg/dL (ref 0.0–40.0)

## 2024-05-12 LAB — CBC WITH DIFFERENTIAL/PLATELET
Basophils Absolute: 0 K/uL (ref 0.0–0.1)
Basophils Relative: 0.4 % (ref 0.0–3.0)
Eosinophils Absolute: 0.1 K/uL (ref 0.0–0.7)
Eosinophils Relative: 1.9 % (ref 0.0–5.0)
HCT: 39.1 % (ref 36.0–46.0)
Hemoglobin: 12.9 g/dL (ref 12.0–15.0)
Lymphocytes Relative: 23.5 % (ref 12.0–46.0)
Lymphs Abs: 1.5 K/uL (ref 0.7–4.0)
MCHC: 33 g/dL (ref 30.0–36.0)
MCV: 89.5 fl (ref 78.0–100.0)
Monocytes Absolute: 0.5 K/uL (ref 0.1–1.0)
Monocytes Relative: 7.3 % (ref 3.0–12.0)
Neutro Abs: 4.3 K/uL (ref 1.4–7.7)
Neutrophils Relative %: 66.9 % (ref 43.0–77.0)
Platelets: 249 K/uL (ref 150.0–400.0)
RBC: 4.37 Mil/uL (ref 3.87–5.11)
RDW: 14.1 % (ref 11.5–15.5)
WBC: 6.5 K/uL (ref 4.0–10.5)

## 2024-05-12 LAB — COMPREHENSIVE METABOLIC PANEL WITH GFR
ALT: 12 U/L (ref 3–35)
AST: 18 U/L (ref 5–37)
Albumin: 4.7 g/dL (ref 3.5–5.2)
Alkaline Phosphatase: 69 U/L (ref 39–117)
BUN: 18 mg/dL (ref 6–23)
CO2: 25 meq/L (ref 19–32)
Calcium: 10.3 mg/dL (ref 8.4–10.5)
Chloride: 105 meq/L (ref 96–112)
Creatinine, Ser: 0.92 mg/dL (ref 0.40–1.20)
GFR: 71.95 mL/min
Glucose, Bld: 89 mg/dL (ref 70–99)
Potassium: 4.2 meq/L (ref 3.5–5.1)
Sodium: 139 meq/L (ref 135–145)
Total Bilirubin: 0.4 mg/dL (ref 0.2–1.2)
Total Protein: 7.7 g/dL (ref 6.0–8.3)

## 2024-05-12 LAB — HEMOGLOBIN A1C: Hgb A1c MFr Bld: 5.5 % (ref 4.6–6.5)

## 2024-05-12 LAB — VITAMIN D 25 HYDROXY (VIT D DEFICIENCY, FRACTURES): VITD: 39.44 ng/mL (ref 30.00–100.00)

## 2024-05-12 NOTE — Patient Instructions (Addendum)
 Please stop by lab before you go If you have mychart- we will send your results within 3 business days of us  receiving them.  If you do not have mychart- we will call you about results within 5 business days of us  receiving them.  *please also note that you will see labs on mychart as soon as they post. I will later go in and write notes on them- will say notes from Dr. Katrinka   No changes today unless labs lead us  to make changes  Recommended follow up: Return in about 6 months (around 11/09/2024) for followup or sooner if needed.Schedule b4 you leave.

## 2024-05-12 NOTE — Progress Notes (Signed)
 " Phone [phone]   Subjective:  Patient presents today for their annual physical. Chief complaint-noted.   See problem oriented charting- ROS- full  review of systems was completed and negative except for topics noted under acute/chronic concerns   The following were reviewed and entered/updated in epic: Past Medical History:  Diagnosis Date   HOH (hard of hearing)    Hypertension    Pre-eclampsia    Vitamin D  deficiency 04/25/2011   26 /23.5   Patient Active Problem List   Diagnosis Date Noted   Hyperglycemia 05/04/2022    Priority: Medium    Essential hypertension 11/15/2021    Priority: Medium    Vitamin D  deficiency 11/15/2021    Priority: Medium    IUD (intrauterine device) in place 11/15/2021    Priority: Low   Hearing loss of both ears 06/25/2011    Priority: Low   Past Surgical History:  Procedure Laterality Date   CESAREAN SECTION  09/04/2003   CESAREAN SECTION  08/11/2005   Mirena      inserted 10-26-20    Family History  Problem Relation Age of Onset   Hypertension Mother    Hypertension Father    Stroke Father        age 17   Hypertension Sister    Hypertension Brother    Diabetes Brother    Other Maternal Grandmother        31 in 2023   Diabetes Maternal Aunt    Aneurysm Maternal Aunt        brain   Colon cancer Neg Hx    Colon polyps Neg Hx    Esophageal cancer Neg Hx    Stomach cancer Neg Hx    Rectal cancer Neg Hx     Medications- reviewed and updated Current Outpatient Medications  Medication Sig Dispense Refill   fluticasone  (FLONASE ) 50 MCG/ACT nasal spray Place 1 spray into both nostrils 2 (two) times daily. 1 g 0   ibuprofen (ADVIL) 400 MG tablet TAKE 1 TABLET (400 MG TOTAL) BY MOUTH EVERY 8 (EIGHT) HOURS AS NEEDED FOR PAIN FOR UP TO 14 DAYS     levonorgestrel  (MIRENA ) 20 MCG/DAY IUD 1 each by Intrauterine route once.     losartan  (COZAAR ) 100 MG tablet TAKE 1 TABLET BY MOUTH EVERY DAY 90 tablet 3   metroNIDAZOLE  (FLAGYL ) 500  MG tablet Take 1 tablet (500 mg total) by mouth 2 (two) times daily. 14 tablet 0   Multiple Vitamin (MULTIVITAMIN PO) Take by mouth.     ondansetron  (ZOFRAN -ODT) 4 MG disintegrating tablet TAKE 1 TABLET BY MOUTH EVERY 8 HOURS AS NEEDED FOR NAUSEA AND VOMITING 18 tablet 1   Prasterone  (INTRAROSA ) 6.5 MG INST Place 1 suppository vaginally at bedtime as needed. 30 each 12   No current facility-administered medications for this visit.    Allergies-reviewed and updated Allergies[1]  Social History   Social History Narrative   Married in [address]. 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 Oropharynx normal Neck: no thyromegaly CV: RRR no murmurs rubs or gallops Lungs: CTAB no crackles, wheeze, rhonchi Abdomen: soft/nontender/nondistended/normal bowel sounds. No rebound or guarding.  Ext: no edema Skin: warm, dry Neuro: grossly normal, moves all extremities, PERRLA   Assessment and Plan   54 y.o. female presenting for annual physical.  Health Maintenance counseling: 1. Anticipatory guidance: Patient counseled regarding regular dental exams -q6 months- started electric toothbrush due to gum regression, eye exams - yearly,  avoiding smoking and second hand smoke , limiting alcohol to 1 beverage per day- none , no illicit drugs .   2. Risk factor reduction:  Advised patient of need for regular exercise and diet rich and fruits and vegetables to reduce risk of heart attack and stroke.  Exercise- set back on exercise with hip/groin pain.  Diet/weight management-weight up 3 lbs  from last physical but slight down from our last visit- optivia and weight watchers in past- has recently started optivia on January 4th- she's hopeful for improvement with this change.  Wt Readings from Last 3 Encounters:  05/12/24 198 lb 6.4 oz (90 kg)  05/08/24 201 lb (91.2 kg)  03/07/24 205 lb 12.8 oz (93.4 kg)  3. Immunizations/screenings/ancillary studies- declines COVID, flu, shingrix, Prevnar 20 Immunization History  Administered Date(s) Administered   PFIZER(Purple Top)SARS-COV-2 Vaccination 06/20/2019, 07/12/2019, 12/11/2020, 08/18/2021   Tdap 12/14/2011, 05/04/2022   4. Cervical cancer screening- 05/08/23 with 5 year repeat 5. Breast cancer screening-  breast exam with gyn and mammogram  10/24/23 6. Colon cancer screening - 12/01/20 with 7 year repeat with tubular adenoma history 7. Skin cancer screening- sees dermatology yearly. advised regular sunscreen use. Denies worrisome, changing, or new skin lesions.  8. Birth control/STD check- IUD and monogamous. No cycles with IUD so harder to tell about menopause. Reports there is no hot flashes.  9. Osteoporosis screening at 8- will plan on this 10. Smoking associated screening - never smoker  Status of chronic or acute concerns   # Left hip pain-had injection December 8 with [facility]-fortunately this improved her pain-did also consider injections in the gluteal tendons and potentially see spine specialist - she reports groin pain has improved - not a constant ache like it was but still bothering her with extended walking- she's thankful at least not constant. For instance 8 minutes into treadmill yesterday started bothering her even at slow pace.  -she sent him a message yesterday- for impression on hip/groin and also shoulder still bothering her on the left with activity  #bacterial vaginosis- currently on flagyl . We opted to hold off on uacr test just in case any urinary irritation  #hypertension S: medication: Losartan   100 mg BP Readings from Last 3 Encounters:  05/12/24 124/72  05/08/24 118/80  03/07/24 118/78  A/P:  well controlled continue current medications    #screening hyperlipidemia S: Medication:none  The 10-year ASCVD risk score (Arnett DK, et al., 2019) is: 2.7% Lab Results  Component Value Date   CHOL 188 05/14/2023   HDL 60.90 05/14/2023   LDLCALC 112 (H) 05/14/2023   TRIG 72.0 05/14/2023   CHOLHDL 3 05/14/2023  A/P: with family history stroke dad age 81 possibly could do CT calcium at 5% risk though I'm honestly not overly worried about that with her mildly elevated #s and the fact that she is female compared to female father   # Hyperglycemia/insulin resistance/prediabetes-peak A1c 5.8 S:  Medication: None Lab Results  Component Value Date   HGBA1C 5.8 11/26/2023  HGBA1C 5.6 05/14/2023   HGBA1C 5.8 11/13/2022  A/P: hopefully stable- update a1c today. Continue without meds for now   # Chronic right jaw pain/swelling-intermittent issues since prior to July 2023 when I met her.  Still has raised area but not as tender thankfully and has not worsened. Prior extensive workup  #Vitamin D  deficiency S: Medication:  multivitamin only Last vitamin D  Lab Results  Component Value Date   VD25OH 36.31 05/14/2023  A/P: hopefully stable- update vita d today. Continue current meds for now  - if decreases may need supplement  #Right CMC arthritis- injection a year ago helpful- has not needed another yet but is getting mild worsening with time  Recommended follow up: Return in about 6 months (around 11/09/2024) for followup or sooner if needed.Schedule b4 you leave. Future Appointments  Date Time Provider Department Center  05/11/2025  1:30 PM Glennon Almarie POUR, MD [facility] None   Lab/Order associations: fasting   ICD-10-CM   1. Preventative health care  Z00.00     2. Screening for hyperlipidemia  Z13.220 Comprehensive metabolic panel    Lipid panel    3. Screening, deficiency  anemia, iron  Z13.0 CBC with Differential/Platelet    4. Screening for diabetes mellitus  Z13.1 Hemoglobin A1c    5. Obesity (BMI 30-39.9)  E66.9 Hemoglobin A1c    6. Vitamin D  deficiency  E55.9 VITAMIN D  25 Hydroxy (Vit-D Deficiency, Fractures)    7. Essential hypertension  I10 CANCELED: Microalbumin / creatinine urine ratio      No orders of the defined types were placed in this encounter.   Return precautions advised.  Garnette Lukes, MD      [1] No Known Allergies  "

## 2024-11-10 ENCOUNTER — Ambulatory Visit: Admitting: Family Medicine

## 2025-05-11 ENCOUNTER — Ambulatory Visit: Admitting: Obstetrics and Gynecology

## 2025-05-14 ENCOUNTER — Encounter: Admitting: Family Medicine
